# Patient Record
Sex: Male | Born: 1935 | Race: White | Hispanic: No | Marital: Married | Smoking: Never smoker
Health system: Southern US, Community
[De-identification: ages and names within clinical notes are randomized; demographics above are authoritative.]

## PROBLEM LIST (undated history)

## (undated) DIAGNOSIS — E785 Hyperlipidemia, unspecified: Secondary | ICD-10-CM

## (undated) DIAGNOSIS — T7840XA Allergy, unspecified, initial encounter: Secondary | ICD-10-CM

## (undated) DIAGNOSIS — F419 Anxiety disorder, unspecified: Secondary | ICD-10-CM

## (undated) DIAGNOSIS — D126 Benign neoplasm of colon, unspecified: Secondary | ICD-10-CM

## (undated) DIAGNOSIS — H8110 Benign paroxysmal vertigo, unspecified ear: Secondary | ICD-10-CM

## (undated) DIAGNOSIS — I10 Essential (primary) hypertension: Secondary | ICD-10-CM

## (undated) HISTORY — DX: Hyperlipidemia, unspecified: E78.5

## (undated) HISTORY — DX: Benign paroxysmal vertigo, unspecified ear: H81.10

## (undated) HISTORY — PX: OTHER SURGICAL HISTORY: SHX169

## (undated) HISTORY — DX: Benign neoplasm of colon, unspecified: D12.6

## (undated) HISTORY — DX: Essential (primary) hypertension: I10

## (undated) HISTORY — DX: Allergy, unspecified, initial encounter: T78.40XA

## (undated) HISTORY — DX: Anxiety disorder, unspecified: F41.9

---

## 1999-10-29 ENCOUNTER — Encounter: Admission: RE | Admit: 1999-10-29 | Discharge: 1999-10-29 | Payer: Self-pay | Admitting: Family Medicine

## 1999-10-29 ENCOUNTER — Encounter: Payer: Self-pay | Admitting: Family Medicine

## 2005-02-11 ENCOUNTER — Ambulatory Visit: Payer: Self-pay | Admitting: Family Medicine

## 2005-02-12 ENCOUNTER — Ambulatory Visit: Payer: Self-pay

## 2006-02-19 DIAGNOSIS — D126 Benign neoplasm of colon, unspecified: Secondary | ICD-10-CM

## 2006-02-19 HISTORY — DX: Benign neoplasm of colon, unspecified: D12.6

## 2006-03-02 ENCOUNTER — Ambulatory Visit: Payer: Self-pay | Admitting: Gastroenterology

## 2006-03-18 ENCOUNTER — Ambulatory Visit: Payer: Self-pay | Admitting: Gastroenterology

## 2006-03-18 ENCOUNTER — Encounter (INDEPENDENT_AMBULATORY_CARE_PROVIDER_SITE_OTHER): Payer: Self-pay | Admitting: *Deleted

## 2006-03-18 HISTORY — PX: COLONOSCOPY: SHX174

## 2006-06-19 ENCOUNTER — Ambulatory Visit: Payer: Self-pay | Admitting: Family Medicine

## 2006-09-03 HISTORY — PX: MOHS SURGERY: SHX181

## 2006-12-09 DIAGNOSIS — I10 Essential (primary) hypertension: Secondary | ICD-10-CM

## 2009-06-20 ENCOUNTER — Telehealth: Payer: Self-pay | Admitting: Family Medicine

## 2009-06-26 ENCOUNTER — Ambulatory Visit: Payer: Self-pay | Admitting: Family Medicine

## 2009-06-26 DIAGNOSIS — L309 Dermatitis, unspecified: Secondary | ICD-10-CM

## 2009-06-26 DIAGNOSIS — Z860100 Personal history of colon polyps, unspecified: Secondary | ICD-10-CM | POA: Insufficient documentation

## 2009-06-26 DIAGNOSIS — J309 Allergic rhinitis, unspecified: Secondary | ICD-10-CM | POA: Insufficient documentation

## 2009-06-26 DIAGNOSIS — N401 Enlarged prostate with lower urinary tract symptoms: Secondary | ICD-10-CM

## 2009-10-03 ENCOUNTER — Ambulatory Visit: Payer: Self-pay | Admitting: Family Medicine

## 2009-10-03 DIAGNOSIS — H8110 Benign paroxysmal vertigo, unspecified ear: Secondary | ICD-10-CM | POA: Insufficient documentation

## 2009-11-05 ENCOUNTER — Telehealth: Payer: Self-pay | Admitting: Family Medicine

## 2009-11-20 ENCOUNTER — Encounter: Admission: RE | Admit: 2009-11-20 | Discharge: 2009-12-17 | Payer: Self-pay | Admitting: Family Medicine

## 2009-12-20 ENCOUNTER — Encounter: Payer: Self-pay | Admitting: Family Medicine

## 2010-05-19 LAB — CONVERTED CEMR LAB
ALT: 36 units/L (ref 0–53)
AST: 24 units/L (ref 0–37)
Albumin: 4.2 g/dL (ref 3.5–5.2)
Alkaline Phosphatase: 55 units/L (ref 39–117)
BUN: 17 mg/dL (ref 6–23)
Basophils Absolute: 0 10*3/uL (ref 0.0–0.1)
Basophils Relative: 0.9 % (ref 0.0–3.0)
Bilirubin, Direct: 0 mg/dL (ref 0.0–0.3)
CO2: 35 meq/L — ABNORMAL HIGH (ref 19–32)
Calcium: 9.3 mg/dL (ref 8.4–10.5)
Chloride: 107 meq/L (ref 96–112)
Cholesterol: 179 mg/dL (ref 0–200)
Creatinine, Ser: 0.9 mg/dL (ref 0.4–1.5)
Eosinophils Absolute: 0.2 10*3/uL (ref 0.0–0.7)
Eosinophils Relative: 4 % (ref 0.0–5.0)
GFR calc non Af Amer: 88.17 mL/min (ref >60)
Glucose, Bld: 99 mg/dL (ref 70–99)
HCT: 47.6 % (ref 39.0–52.0)
HDL: 57 mg/dL (ref >39.00)
Hemoglobin: 15.8 g/dL (ref 13.0–17.0)
LDL Cholesterol: 110 mg/dL — ABNORMAL HIGH (ref 0–99)
Lymphocytes Relative: 21.9 % (ref 12.0–46.0)
Lymphs Abs: 1 10*3/uL (ref 0.7–4.0)
MCHC: 33.1 g/dL (ref 30.0–36.0)
MCV: 98.2 fL (ref 78.0–100.0)
Monocytes Absolute: 0.3 10*3/uL (ref 0.1–1.0)
Monocytes Relative: 7.5 % (ref 3.0–12.0)
Neutro Abs: 3.1 10*3/uL (ref 1.4–7.7)
Neutrophils Relative %: 65.7 % (ref 43.0–77.0)
PSA: 1.63 ng/mL (ref 0.10–4.00)
Phosphorus: 3.4 mg/dL (ref 2.3–4.6)
Platelets: 232 10*3/uL (ref 150.0–400.0)
Potassium: 4.3 meq/L (ref 3.5–5.1)
RBC: 4.84 M/uL (ref 4.22–5.81)
RDW: 12.1 % (ref 11.5–14.6)
Sodium: 145 meq/L (ref 135–145)
TSH: 0.98 microintl units/mL (ref 0.35–5.50)
Total Bilirubin: 0.7 mg/dL (ref 0.3–1.2)
Total CHOL/HDL Ratio: 3
Total Protein: 7.4 g/dL (ref 6.0–8.3)
Triglycerides: 60 mg/dL (ref 0.0–149.0)
VLDL: 12 mg/dL (ref 0.0–40.0)
WBC: 4.6 10*3/uL (ref 4.5–10.5)

## 2010-05-21 NOTE — Progress Notes (Signed)
Summary: wants antibotic  Phone Note Call from Patient Call back at Home Phone [phone]   Caller: Spouse Call For: Nelwyn Salisbury MD Summary of Call: pt is having n/v, diarrhea and wants an antibotic called into [facility] Initial call taken by: Alfred Levins, CMA,  June 20, 2009 1:40 PM  Follow-up for Phone Call        we do not treat viral gastroenteritis with antibiotics. Drink fluids and try Imodium for diarrhea. Call in phenergan 25 mg tabs, 1 q 4hours as needed nausea, #30 with no rf Follow-up by: Nelwyn Salisbury MD,  June 20, 2009 5:13 PM  Additional Follow-up for Phone Call Additional follow up Details #1::        Phone Call Completed, Rx Called In Additional Follow-up by: Alfred Levins, CMA,  June 20, 2009 5:15 PM    New/Updated Medications: PROMETHAZINE HCL 25 MG  TABS (PROMETHAZINE HCL) 1 by mouth every 4 hours as needed for nausea Prescriptions: PROMETHAZINE HCL 25 MG  TABS (PROMETHAZINE HCL) 1 by mouth every 4 hours as needed for nausea  #30 x 0   Entered by:   Alfred Levins, CMA   Authorized by:   Nelwyn Salisbury MD   Signed by:   Alfred Levins, CMA on 06/20/2009   Method used:   Electronically to        [facility] (retail)       [address]       [place], [place]  [address]       Ph: [phone]       Fax: [phone]   RxID:   [id number]

## 2010-05-21 NOTE — Assessment & Plan Note (Signed)
Summary: F/U ON DIZZINESS // RS   Vital Signs:  Patient profile:   75 year old male Weight:      184 pounds Temp:     98.0 degrees F oral Pulse rate:   56 / minute Pulse rhythm:   regular BP sitting:   144 / 86  (left arm) Cuff size:   regular  Vitals Entered By: Raechel Ache, RN (October 03, 2009 9:23 AM) CC: C/o feeling worse with dizziness and fast pulse at night.   History of Present Illness: Here to discuss continued episodes of dizziness which he ddescribes as the room spinning. This occurs sevral days a week, and it lasts anywhere from a few minutes to an hour or so each time. Moving his head quickly from side to side is usually what triggers it. No other neurologic deficits. This started about 3 months ago.   Allergies: No Known Drug Allergies  Past History:  Past Medical History: Reviewed history from 06/26/2009 and no changes required. Glaucoma, sees Dr. Fransico Michael in [facility] Cholesterol Hypertension Allergic rhinitis catarracts normal stress Myoview 2006  Past Surgical History: Reviewed history from 06/26/2009 and no changes required. R foot nerve ablation MOHS to remove a basal cell carcinoma from the right ear lobe 09-03-06 per Dr. Ellen Henri colonoscopy 03-18-06 per Dr. Russella Dar, repeat in 5 yrs  Review of Systems  The patient denies anorexia, fever, weight loss, weight gain, vision loss, decreased hearing, hoarseness, chest pain, syncope, dyspnea on exertion, peripheral edema, prolonged cough, headaches, hemoptysis, abdominal pain, melena, hematochezia, severe indigestion/heartburn, hematuria, incontinence, genital sores, muscle weakness, suspicious skin lesions, transient blindness, difficulty walking, depression, unusual weight change, abnormal bleeding, enlarged lymph nodes, angioedema, breast masses, and testicular masses.    Physical Exam  General:  Well-developed,well-nourished,in no acute distress; alert,appropriate and cooperative throughout  examination Head:  Normocephalic and atraumatic without obvious abnormalities. No apparent alopecia or balding. Eyes:  No corneal or conjunctival inflammation noted. EOMI. Perrla. Funduscopic exam benign, without hemorrhages, exudates or papilledema. Vision grossly normal. Ears:  External ear exam shows no significant lesions or deformities.  Otoscopic examination reveals clear canals, tympanic membranes are intact bilaterally without bulging, retraction, inflammation or discharge. Hearing is grossly normal bilaterally. Neck:  No deformities, masses, or tenderness noted. Lungs:  Normal respiratory effort, chest expands symmetrically. Lungs are clear to auscultation, no crackles or wheezes. Heart:  Normal rate and regular rhythm. S1 and S2 normal without gallop, murmur, click, rub or other extra sounds. Neurologic:  No cranial nerve deficits noted. Station and gait are normal. Plantar reflexes are down-going bilaterally. DTRs are symmetrical throughout. Sensory, motor and coordinative functions appear intact.   Impression & Recommendations:  Problem # 1:  BENIGN POSITIONAL VERTIGO (ICD-386.11)  Complete Medication List: 1)  Triamcinolone Acetonide 0.5 % Crea (Triamcinolone acetonide) .... Apply two times a day as needed  Patient Instructions: 1)  Since this has been going on for awhile, I suggested vestibular rehab to teach him how to eradicate the symptoms. He wants to think about it first, and he will let me know what he wants to do.

## 2010-05-21 NOTE — Assessment & Plan Note (Signed)
Summary: emp-will fast//ccm/pt rsc/cjr   Vital Signs:  Patient profile:   75 year old male Height:      72 inches Weight:      186 pounds BMI:     25.32 Temp:     97.7 degrees F oral Pulse rate:   62 / minute BP sitting:   122 / 84  (left arm) Cuff size:   large  Vitals Entered By: Alfred Levins, CMA (June 26, 2009 9:04 AM) CC: cpx, fasting   Contraindications/Deferment of Procedures/Staging:    Test/Procedure: Pneumovax vaccine    Reason for deferment: patient declined   History of Present Illness: 75 yr old male for cpx. He feels well in general. He and his wife are under a lot of stress, and he spends a great deal of time telling me about this. They had used a lot of their retirement funds to help out their children financially, but this left them very short on money. Now theyt are faced with foreclosure, and they are losing their home. he thinks he is handling this well, and he does not need any assistance from [place]. he is worried about how this is affecting his wife. He does have itchy patches of skin on his lower back.   Current Medications (verified): 1)  None  Allergies (verified): No Known Drug Allergies  Past History:  Past Medical History: Glaucoma, sees Dr. Fransico Michael in [facility] Cholesterol Hypertension Allergic rhinitis catarracts normal stress Myoview 2006  Past Surgical History: R foot nerve ablation MOHS to remove a basal cell carcinoma from the right ear lobe 09-03-06 per Dr. Ellen Henri colonoscopy 03-18-06 per Dr. Russella Dar, repeat in 5 yrs  Family History: Reviewed history from 12/09/2006 and no changes required. Family History Breast cancer 1st degree relative <50 Family History Hypertension Family History of Prostate CA 1st degree relative <50 Family History of Stroke M 1st degree relative <50 Family History of Cardiovascular disorder Family History of Respiratory disease  Social History: Reviewed history from 12/09/2006 and no changes  required. Never Smoked Retired Married Alcohol use-yes Drug use-no  Review of Systems  The patient denies anorexia, fever, weight loss, weight gain, vision loss, decreased hearing, hoarseness, chest pain, syncope, dyspnea on exertion, peripheral edema, prolonged cough, headaches, hemoptysis, abdominal pain, melena, hematochezia, severe indigestion/heartburn, hematuria, incontinence, genital sores, muscle weakness, suspicious skin lesions, transient blindness, difficulty walking, depression, unusual weight change, abnormal bleeding, enlarged lymph nodes, angioedema, breast masses, and testicular masses.    Physical Exam  General:  Well-developed,well-nourished,in no acute distress; alert,appropriate and cooperative throughout examination Head:  Normocephalic and atraumatic without obvious abnormalities. No apparent alopecia or balding. Eyes:  No corneal or conjunctival inflammation noted. EOMI. Perrla. Funduscopic exam benign, without hemorrhages, exudates or papilledema. Vision grossly normal. Ears:  External ear exam shows no significant lesions or deformities.  Otoscopic examination reveals clear canals, tympanic membranes are intact bilaterally without bulging, retraction, inflammation or discharge. Hearing is grossly normal bilaterally. Nose:  External nasal examination shows no deformity or inflammation. Nasal mucosa are pink and moist without lesions or exudates. Mouth:  Oral mucosa and oropharynx without lesions or exudates.  Teeth in good repair. Neck:  No deformities, masses, or tenderness noted. Chest Wall:  No deformities, masses, tenderness or gynecomastia noted. Lungs:  Normal respiratory effort, chest expands symmetrically. Lungs are clear to auscultation, no crackles or wheezes. Heart:  Normal rate and regular rhythm. S1 and S2 normal without gallop, murmur, click, rub or other extra sounds. EKG normal Abdomen:  Bowel sounds positive,abdomen soft and non-tender without masses,  organomegaly or hernias noted. Rectal:  No external abnormalities noted. Normal sphincter tone. No rectal masses or tenderness. Heme neg.  Genitalia:  Testes bilaterally descended without nodularity, tenderness or masses. No scrotal masses or lesions. No penis lesions or urethral discharge. Prostate:  no nodules, no asymmetry, no induration, and 1+ enlarged.   Msk:  No deformity or scoliosis noted of thoracic or lumbar spine.   Pulses:  R and L carotid,radial,femoral,dorsalis pedis and posterior tibial pulses are full and equal bilaterally Extremities:  No clubbing, cyanosis, edema, or deformity noted with normal full range of motion of all joints.   Neurologic:  No cranial nerve deficits noted. Station and gait are normal. Plantar reflexes are down-going bilaterally. DTRs are symmetrical throughout. Sensory, motor and coordinative functions appear intact. Skin:  Intact without suspicious lesions. Has a large patch of red, scaly, macular skin on the lower back Cervical Nodes:  No lymphadenopathy noted Axillary Nodes:  No palpable lymphadenopathy Inguinal Nodes:  No significant adenopathy Psych:  Cognition and judgment appear intact. Alert and cooperative with normal attention span and concentration. No apparent delusions, illusions, hallucinations   Impression & Recommendations:  Problem # 1:  BENIGN PROSTATIC HYPERTROPHY, WITH OBSTRUCTION (ICD-600.01)  Orders: TLB-PSA (Prostate Specific Antigen) (84153-PSA)  Problem # 2:  COLONIC POLYPS, HX OF (ICD-V12.72)  Orders: Hemoccult Guaiac-1 spec.(in office) (82270)  Problem # 3:  ALLERGIC RHINITIS (ICD-477.9)  The following medications were removed from the medication list:    Promethazine Hcl 25 Mg Tabs (Promethazine hcl) .Marland Kitchen... 1 by mouth every 4 hours as needed for nausea  Problem # 4:  HYPERTENSION (ICD-401.9)  Orders: EKG w/ Interpretation (93000) UA Dipstick w/o Micro (automated)  (81003) Venipuncture (16109) TLB-Lipid Panel  (80061-LIPID) TLB-Renal Function Panel (80069-RENAL) TLB-BMP (Basic Metabolic Panel-BMET) (80048-METABOL) TLB-CBC Platelet - w/Differential (85025-CBCD) TLB-Hepatic/Liver Function Pnl (80076-HEPATIC) TLB-TSH (Thyroid Stimulating Hormone) (84443-TSH)  Problem # 5:  ECZEMA (ICD-692.9)  The following medications were removed from the medication list:    Promethazine Hcl 25 Mg Tabs (Promethazine hcl) .Marland Kitchen... 1 by mouth every 4 hours as needed for nausea His updated medication list for this problem includes:    Triamcinolone Acetonide 0.5 % Crea (Triamcinolone acetonide) .Marland Kitchen... Apply two times a day as needed  Complete Medication List: 1)  Triamcinolone Acetonide 0.5 % Crea (Triamcinolone acetonide) .... Apply two times a day as needed  Patient Instructions: 1)  get labs today. Prescriptions: TRIAMCINOLONE ACETONIDE 0.5 % CREA (TRIAMCINOLONE ACETONIDE) apply two times a day as needed  #60 x 5   Entered and Authorized by:   Nelwyn Salisbury MD   Signed by:   Nelwyn Salisbury MD on 06/26/2009   Method used:   Electronically to        [facility] (retail)       [address]       [place], [place]  [address]       Ph: [phone]       Fax: [phone]   RxID:   [id number]   Preventive Care Screening  Colonoscopy:    Date:  04/21/2006    Next Due:  04/2011    Results:  polyps    Appended Document: emp-will fast//ccm/pt rsc/cjr  Laboratory Results   Urine Tests    Routine Urinalysis   Color: yellow Appearance: Clear Glucose: negative   (Normal Range: Negative) Bilirubin: negative   (Normal Range: Negative)  Ketone: negative   (Normal Range: Negative) Spec. Gravity: 1.020   (Normal Range: 1.003-1.035) Blood: negative   (Normal Range: Negative) pH: 7.0   (Normal Range: 5.0-8.0) Protein: negative   (Normal Range: Negative) Urobilinogen: 0.2   (Normal Range: 0-1) Nitrite: negative   (Normal Range: Negative) Leukocyte Esterace: negative    (Normal Range: Negative)    Comments: Rita Ohara  June 26, 2009 10:59 AM

## 2010-05-21 NOTE — Progress Notes (Signed)
  Phone Note Call from Patient Call back at Home Phone [phone] Call back at Work Phone [phone]   Reason for Call: Referral Summary of Call: pt wants to get referral completed that he had talked to Dr. Clent Ridges about for head dizziness issue.  Follow-up for Phone Call        refer him to PT for vestibular rehab for chronic vertigo Follow-up by: Nelwyn Salisbury MD,  November 05, 2009 8:58 AM

## 2010-05-21 NOTE — Miscellaneous (Signed)
Summary: Initial Summary for PT/[place]  Initial Summary for PT/[place]   Imported By: Sherian Rein 12/26/2009 13:55:44  _____________________________________________________________________  External Attachment:    Type:   Image     Comment:   External Document

## 2010-06-20 ENCOUNTER — Inpatient Hospital Stay (HOSPITAL_COMMUNITY)
Admission: EM | Admit: 2010-06-20 | Discharge: 2010-06-22 | DRG: 309 | Disposition: A | Discharge status: Home / Self Care | Payer: Medicare Other | Attending: Internal Medicine | Admitting: Internal Medicine

## 2010-06-20 ENCOUNTER — Emergency Department (HOSPITAL_COMMUNITY): Payer: Medicare Other

## 2010-06-20 DIAGNOSIS — I5032 Chronic diastolic (congestive) heart failure: Secondary | ICD-10-CM | POA: Diagnosis present

## 2010-06-20 DIAGNOSIS — Z803 Family history of malignant neoplasm of breast: Secondary | ICD-10-CM

## 2010-06-20 DIAGNOSIS — Z8249 Family history of ischemic heart disease and other diseases of the circulatory system: Secondary | ICD-10-CM

## 2010-06-20 DIAGNOSIS — Z7982 Long term (current) use of aspirin: Secondary | ICD-10-CM

## 2010-06-20 DIAGNOSIS — Z823 Family history of stroke: Secondary | ICD-10-CM

## 2010-06-20 DIAGNOSIS — I4891 Unspecified atrial fibrillation: Principal | ICD-10-CM | POA: Diagnosis present

## 2010-06-20 DIAGNOSIS — I509 Heart failure, unspecified: Secondary | ICD-10-CM | POA: Diagnosis present

## 2010-06-20 LAB — CBC
HCT: 42 % (ref 39.0–52.0)
Hemoglobin: 15.2 g/dL (ref 13.0–17.0)
MCH: 32.9 pg (ref 26.0–34.0)
MCHC: 36.2 g/dL — ABNORMAL HIGH (ref 30.0–36.0)
MCV: 90.9 fL (ref 78.0–100.0)
Platelets: 216 10*3/uL (ref 150–400)
RBC: 4.62 MIL/uL (ref 4.22–5.81)
RDW: 12.6 % (ref 11.5–15.5)
WBC: 7.6 10*3/uL (ref 4.0–10.5)

## 2010-06-20 LAB — BASIC METABOLIC PANEL
BUN: 23 mg/dL (ref 6–23)
CO2: 25 mEq/L (ref 19–32)
Calcium: 9.4 mg/dL (ref 8.4–10.5)
Chloride: 107 mEq/L (ref 96–112)
Creatinine, Ser: 1 mg/dL (ref 0.4–1.5)
GFR calc Af Amer: >60 mL/min (ref >60)
GFR calc non Af Amer: >60 mL/min (ref >60)
Glucose, Bld: 99 mg/dL (ref 70–99)
Potassium: 3.9 mEq/L (ref 3.5–5.1)
Sodium: 142 mEq/L (ref 135–145)

## 2010-06-20 LAB — DIFFERENTIAL
Basophils Absolute: 0 10*3/uL (ref 0.0–0.1)
Basophils Relative: 0 % (ref 0–1)
Eosinophils Absolute: 0.3 10*3/uL (ref 0.0–0.7)
Eosinophils Relative: 4 % (ref 0–5)
Lymphocytes Relative: 24 % (ref 12–46)
Lymphs Abs: 1.8 10*3/uL (ref 0.7–4.0)
Monocytes Absolute: 0.7 10*3/uL (ref 0.1–1.0)
Monocytes Relative: 9 % (ref 3–12)
Neutro Abs: 4.8 10*3/uL (ref 1.7–7.7)
Neutrophils Relative %: 63 % (ref 43–77)

## 2010-06-21 DIAGNOSIS — I4891 Unspecified atrial fibrillation: Secondary | ICD-10-CM

## 2010-06-21 LAB — COMPREHENSIVE METABOLIC PANEL
AST: 22 U/L (ref 0–37)
Albumin: 3.5 g/dL (ref 3.5–5.2)
Chloride: 107 mEq/L (ref 96–112)
Creatinine, Ser: 0.94 mg/dL (ref 0.4–1.5)
GFR calc Af Amer: >60 mL/min (ref >60)
Total Bilirubin: 0.9 mg/dL (ref 0.3–1.2)

## 2010-06-21 LAB — CARDIAC PANEL(CRET KIN+CKTOT+MB+TROPI)
CK, MB: 2.7 ng/mL (ref 0.3–4.0)
CK, MB: 2.3 ng/mL (ref 0.3–4.0)
Relative Index: 2.3 (ref 0.0–2.5)
Relative Index: INVALID (ref 0.0–2.5)
Total CK: 86 U/L (ref 7–232)
Troponin I: 0.01 ng/mL (ref 0.00–0.06)

## 2010-06-21 LAB — HEMOGLOBIN A1C
Hgb A1c MFr Bld: 5.8 % — ABNORMAL HIGH (ref <5.7)
Mean Plasma Glucose: 120 mg/dL — ABNORMAL HIGH (ref <117)

## 2010-06-21 LAB — LIPID PANEL
Cholesterol: 195 mg/dL (ref 0–200)
LDL Cholesterol: 131 mg/dL — ABNORMAL HIGH (ref 0–99)
VLDL: 9 mg/dL (ref 0–40)

## 2010-06-21 LAB — CBC
HCT: 40.4 % (ref 39.0–52.0)
Hemoglobin: 14.1 g/dL (ref 13.0–17.0)
MCH: 32.3 pg (ref 26.0–34.0)
MCV: 92.4 fL (ref 78.0–100.0)
RBC: 4.37 MIL/uL (ref 4.22–5.81)

## 2010-06-21 LAB — HEPARIN LEVEL (UNFRACTIONATED): Heparin Unfractionated: 0.75 IU/mL — ABNORMAL HIGH (ref 0.30–0.70)

## 2010-06-22 ENCOUNTER — Encounter: Payer: Self-pay | Admitting: Family Medicine

## 2010-06-22 LAB — BASIC METABOLIC PANEL
BUN: 17 mg/dL (ref 6–23)
Calcium: 8.7 mg/dL (ref 8.4–10.5)
Creatinine, Ser: 0.98 mg/dL (ref 0.4–1.5)
GFR calc non Af Amer: >60 mL/min (ref >60)

## 2010-06-22 LAB — CBC
Platelets: 176 10*3/uL (ref 150–400)
RDW: 12.6 % (ref 11.5–15.5)
WBC: 5 10*3/uL (ref 4.0–10.5)

## 2010-06-25 ENCOUNTER — Encounter: Payer: Self-pay | Admitting: Family Medicine

## 2010-06-25 ENCOUNTER — Ambulatory Visit (INDEPENDENT_AMBULATORY_CARE_PROVIDER_SITE_OTHER): Payer: Medicare Other | Admitting: Family Medicine

## 2010-06-25 VITALS — BP 140/78 | Temp 97.9°F | Ht 72.0 in | Wt 188.0 lb

## 2010-06-25 DIAGNOSIS — F419 Anxiety disorder, unspecified: Secondary | ICD-10-CM

## 2010-06-25 DIAGNOSIS — G4730 Sleep apnea, unspecified: Secondary | ICD-10-CM

## 2010-06-25 DIAGNOSIS — L719 Rosacea, unspecified: Secondary | ICD-10-CM

## 2010-06-25 DIAGNOSIS — I5032 Chronic diastolic (congestive) heart failure: Secondary | ICD-10-CM

## 2010-06-25 DIAGNOSIS — I509 Heart failure, unspecified: Secondary | ICD-10-CM

## 2010-06-25 DIAGNOSIS — F411 Generalized anxiety disorder: Secondary | ICD-10-CM

## 2010-06-25 DIAGNOSIS — I480 Paroxysmal atrial fibrillation: Secondary | ICD-10-CM

## 2010-06-25 DIAGNOSIS — I4891 Unspecified atrial fibrillation: Secondary | ICD-10-CM

## 2010-06-25 MED ORDER — SERTRALINE HCL 50 MG PO TABS: 50.0000 mg | ORAL_TABLET | Freq: Every day | ORAL | Status: DC

## 2010-06-25 MED ORDER — METRONIDAZOLE 1 % EX GEL: Freq: Two times a day (BID) | CUTANEOUS | Status: AC

## 2010-06-25 NOTE — Progress Notes (Signed)
  Subjective:    Patient ID: Roy Obrien, male    DOB: [birth year], 75 y.o.   MRN: [id number]  HPI Here to follow up after a hospital stay from 06-20-10 to 06-22-10 for paroxysmal atrial fibrillation. He had a rapid ventricular rate at first which was controlled with a Cardizem drip. He quickly converted to NSR, and he has felt fine ever since then. No SOB or palpitations or chest pains. He was sent home on aspirin for anticoagulation with a CHADS score of 2, since he also showed some diastolic heart failure. His EF was 60-65%. There was some debate about putting him on Pradaxa or Warfarin, but this decision was left up to [place]. He also mentions a redness and soreness around the tip of the nose for the past month. No problems with the insides of the nostrils. He has been told by his wife and others that he stops breathing frequently in his sleep. He does snore at times. Lastly he admits to being under a lot of stress lately, and he feels like his body is always wound up with stress. He can't relax and has trouble sleeping.    Review of Systems  Constitutional: Negative.   Respiratory: Negative.   Cardiovascular: Negative.   Skin: Positive for rash.       Objective:   Physical Exam  Constitutional: He appears well-developed and well-nourished.  HENT:  Mouth/Throat: Oropharynx is clear and moist.       the external nose is diffusely red with a macular rash  Cardiovascular: Normal rate, regular rhythm, normal heart sounds and intact distal pulses.  Exam reveals no gallop and no friction rub.   No murmur heard. Pulmonary/Chest: Effort normal and breath sounds normal. No respiratory distress. He has no wheezes. He has no rales. He exhibits no tenderness.          Assessment & Plan:  He seems to have had a single episode of atrial  fibrillation, which is now under control, so I do not think we need to go with an anticoagulant with a higher risk profile than aspirin. He agrees, so we will stick  with one 325mg  ASA daily for now. He knows to see me or go to the ER if he feels another fibrillation episode coming on. He seems to have some sleep apnea, and this poses a definite risk for heart disease, so we will refer him for a sleep evaluation. Treat the rosacea with metrogel.

## 2010-06-25 NOTE — Progress Notes (Signed)
Addended by: Dwaine Deter on: 06/25/2010 06:06 PM   Modules accepted: Orders

## 2010-07-01 ENCOUNTER — Encounter: Payer: Self-pay | Admitting: *Deleted

## 2010-07-04 NOTE — Discharge Summary (Signed)
NAMEJERREN, FLINCHBAUGH              ACCOUNT NO.:  [id number]  MEDICAL RECORD NO.:  [id number]           PATIENT TYPE:  I  LOCATION:  3702                         FACILITY:  [facility]  PHYSICIAN:  Peggye Pitt, M.D. DATE OF BIRTH:  [birth year]  DATE OF ADMISSION:  06/20/2010 DATE OF DISCHARGE:  06/22/2010                              DISCHARGE SUMMARY   PRIMARY CARE PHYSICIAN:  Jeannett Senior A. Clent Ridges, MD  DISCHARGE DIAGNOSES: 1. Paroxysmal atrial fibrillation, converted back to normal sinus     rhythm. 2. Chronic diastolic congestive heart failure, new diagnosis.  DISCHARGE MEDICATIONS:  Aspirin 325 mg daily.  DISPOSITION AND FOLLOWUP:  Mr. Kroening will be discharged home today in stable and improved condition.  He has a followup scheduled with his PCP, Dr. Clent Ridges on Tuesday which is 4 days after discharge.  At that point, he will need to discuss with Dr. Clent Ridges whether or not he will continue to take aspirin and will be upgraded to an anticoagulant such as Coumadin versus Pradaxa.  Please see below for details.  CONSULTATION THIS HOSPITALIZATION:  None.  IMAGES AND PROCEDURES: 1. A chest x-ray on June 20, 2010, that showed no active     cardiopulmonary disease. 2. A 2-D echocardiogram on June 21, 2010, that showed an ejection     fraction of 60-65% with mild LVH and grade 2 diastolic dysfunction,     no vulvar abnormalities.  HISTORY AND PHYSICAL:  For complete details, please refer to dictation on June 20, 2010, by Dr. Toniann Fail, but in brief Mr. Tritschler is a pleasant 75 year old Caucasian gentleman with absolutely no past medical history who presented to the emergency department with complaints of palpitations and dizziness.  This started about 2 days prior to admission.  Because of this, he came to the ER where he was found to be in atrial fibrillation with rapid ventricular response.  HOSPITAL COURSE BY PROBLEM: 1. Atrial fibrillation with rapid ventricular response:  This was   noted in the ED on an initial EKG.  He was started on a Cardizem     drip and later on p.o. Cardizem.  However, his heart rate rapidly     dropped to the 40s-50s and he had two 4-second pauses on telemetry.     Because of this, the Cardizem was discontinued.  He has since     converted to a normal sinus rhythm and has been maintaining heart     rates in the 50s-60s.  Because of this, I will not be discharging     him on any rate-controlling medications.  We did have to do a full     workup to determine his CHADS score to see if he would qualify for     anticoagulation.  He has two points, one for his age and the second     one for his diastolic dysfunction which is newly diagnosed on     echocardiogram this admission.  Of course, folks who have a CHADS     score of 2, it is up to physician and patient to determine whether     aspirin or higher level  of anticoagulation is appropriate.  If he     had a CHADS score of 3 or higher, he would definitely qualify for     anticoagulation.  After discussion with Mr. Depree, he prefers at this time to be discharged on a full-dose aspirin 325 mg.  He     definitely does not want Coumadin, but would be willing to consider     one of the newer anticoagulants such as per Pradaxa or Xarelto,     however, he would further like to discuss this with Dr. Clent Ridges prior     to making his decision.  He understands that there is an increased     risk of stroke in folks with atrial fibrillation. 2. It has been a pleasure taking care of Mr. Baquero.  He is most     certainly a very pleasant gentleman and has been in remarkable good     health.  VITAL SIGNS ON DAY OF DISCHARGE:  Blood pressure 121/63, heart rate 56, respirations 18, sats 99% on room air, and temp 976.     Peggye Pitt, M.D.     EH/MEDQ  D:  06/22/2010  T:  06/22/2010  Job:  [id number]  cc:   Jeannett Senior A. Clent Ridges, MD  Electronically Signed by Peggye Pitt M.D. on 07/04/2010 05:38:27 PM

## 2010-07-12 ENCOUNTER — Ambulatory Visit (INDEPENDENT_AMBULATORY_CARE_PROVIDER_SITE_OTHER): Payer: Medicare Other | Admitting: Pulmonary Disease

## 2010-07-12 ENCOUNTER — Encounter: Payer: Self-pay | Admitting: Pulmonary Disease

## 2010-07-12 VITALS — BP 138/64 | HR 64 | Temp 98.0°F | Ht 72.0 in | Wt 188.8 lb

## 2010-07-12 DIAGNOSIS — R0609 Other forms of dyspnea: Secondary | ICD-10-CM

## 2010-07-12 DIAGNOSIS — R0683 Snoring: Secondary | ICD-10-CM | POA: Insufficient documentation

## 2010-07-12 NOTE — Progress Notes (Signed)
  Subjective:    Patient ID: Roy Obrien, male    DOB: March 02, 1936, 75 y.o.   MRN: [id number]  HPI The pt comes in today for evaluation of possible osa.  He was recently found to have PAF, and the question was raised whether he may have osa.  He has been noted to have snoring, and occasionally an abnormal breathing pattern during sleep.  He feels that he is rested 90% of the mornings upon arising.  He does not feel he has a sleepiness issue during the day, and is satisfied with his concentration and alertness.  His weight is down 20 pounds over the last one year, and his epworth today is 9.     Sleep Questionnaire: What time do you typically go to bed?( Between what hours) 10:30 - 11:00pm How long does it take you to fall asleep? 5 mins How many times during the night do you wake up? 2 What time do you get out of bed to start your day? 0730 Do you drive or operate heavy machinery in your occupation? No How much has your weight changed (up or down) over the past two years? (In pounds) 20 lb (9.072 kg) Have you ever had a sleep study before? No Do you currently use CPAP? No Do you wear oxygen at any time? No    Review of Systems  Constitutional: Negative for fever, appetite change and unexpected weight change.  HENT: Positive for sneezing. Negative for ear pain, congestion, sore throat, rhinorrhea, trouble swallowing, dental problem and postnasal drip.   Eyes: Negative for redness.  Respiratory: Negative for cough, shortness of breath and wheezing.   Cardiovascular: Positive for palpitations. Negative for chest pain and leg swelling.  Gastrointestinal: Negative for nausea, abdominal pain and diarrhea.  Genitourinary: Negative for dysuria.  Musculoskeletal: Positive for joint swelling.  Skin: Positive for rash.  Neurological: Negative for syncope and headaches.  Hematological: Does not bruise/bleed easily.  Psychiatric/Behavioral: Negative for dysphoric mood. The patient is not nervous/anxious.         Objective:   Physical Exam  Constitutional: He is oriented to person, place, and time. He appears well-developed and well-nourished.  HENT:       Nares narrowed bilat, no discharge or purulence Moderate elongation of soft palate and uvula  Eyes: EOM are normal. Pupils are equal, round, and reactive to light.  Neck: Neck supple. No JVD present. No tracheal deviation present. No thyromegaly present.  Cardiovascular: Normal rate, regular rhythm and normal heart sounds.   Pulmonary/Chest: Effort normal and breath sounds normal. No respiratory distress. He has no wheezes. He has no rales.  Abdominal: Soft. Bowel sounds are normal. There is no tenderness.  Musculoskeletal: He exhibits no edema.  Lymphadenopathy:    He has no cervical adenopathy.  Neurological: He is alert and oriented to person, place, and time.       Does not appear sleepy, moves all 4   Skin:       No cyanosis          Assessment & Plan:

## 2010-07-12 NOTE — Assessment & Plan Note (Signed)
The pt has new onset afib and has been noted to have snoring with some suggestion of an abnormal breathing pattern during sleep.  However, he does not have the typical body habitus for osa, and his upper airway anatomy is not that abnormal.  He seems to be having restorative sleep, and denies alertness issues during the day.  My suspicion is low for significant osa, but agree he needs screening with his symptoms and recent afib.  I think this is best done with home sleep testing rather than a formal study at the sleep center.  The pt is agreeable to this approach.

## 2010-07-12 NOTE — Patient Instructions (Signed)
Will set up for home sleep study to evaluate for sleep apnea Will call you with the results when available.

## 2010-07-18 ENCOUNTER — Encounter: Payer: Self-pay | Admitting: Family Medicine

## 2010-07-18 ENCOUNTER — Ambulatory Visit (INDEPENDENT_AMBULATORY_CARE_PROVIDER_SITE_OTHER): Payer: Medicare Other | Admitting: Family Medicine

## 2010-07-18 VITALS — BP 120/78 | HR 77 | Temp 98.5°F | Wt 186.0 lb

## 2010-07-18 DIAGNOSIS — F419 Anxiety disorder, unspecified: Secondary | ICD-10-CM

## 2010-07-18 DIAGNOSIS — G4730 Sleep apnea, unspecified: Secondary | ICD-10-CM

## 2010-07-18 DIAGNOSIS — I480 Paroxysmal atrial fibrillation: Secondary | ICD-10-CM

## 2010-07-18 DIAGNOSIS — F411 Generalized anxiety disorder: Secondary | ICD-10-CM

## 2010-07-18 DIAGNOSIS — I4891 Unspecified atrial fibrillation: Secondary | ICD-10-CM

## 2010-07-18 NOTE — Progress Notes (Signed)
  Subjective:    Patient ID: Roy Obrien, male    DOB: 23-Apr-1935, 75 y.o.   MRN: [id number]  HPI Here for follow up on anxiety. We started him on Zoloft about 3 weeks ago. He has been seeing Dr. Shelle Iron about sleep apnea, and he has a sleep study pending later this week. He has felt fine with no chest discomfort or SOB or palpitations. He feels more relaxed and is pleased with the Zoloft. He sleeps better. He asks about a possible referral to Cardiology. His family is worried about him and wants him to see a cardiologist soon.    Review of Systems  Constitutional: Negative.   Respiratory: Negative.   Cardiovascular: Negative.   Psychiatric/Behavioral: Negative.        Objective:   Physical Exam  Constitutional: He appears well-developed and well-nourished.  Cardiovascular: Normal rate, regular rhythm, normal heart sounds and intact distal pulses.  Exam reveals no gallop and no friction rub.   No murmur heard. Pulmonary/Chest: Effort normal and breath sounds normal. No respiratory distress. He has no wheezes. He has no rales. He exhibits no tenderness.  Psychiatric: He has a normal mood and affect. His behavior is normal. Judgment and thought content normal.          Assessment & Plan:  He is feeling better on Zoloft, so we will stay on this. I will see him again in 2 months. We will refer to Cardiology to follow him along with [place].

## 2010-07-22 ENCOUNTER — Encounter: Payer: Self-pay | Admitting: Cardiovascular Disease

## 2010-07-22 ENCOUNTER — Ambulatory Visit (INDEPENDENT_AMBULATORY_CARE_PROVIDER_SITE_OTHER): Payer: Medicare Other | Admitting: Cardiovascular Disease

## 2010-07-22 VITALS — BP 130/80 | HR 62 | Resp 12 | Ht 72.0 in | Wt 184.0 lb

## 2010-07-22 DIAGNOSIS — R002 Palpitations: Secondary | ICD-10-CM | POA: Insufficient documentation

## 2010-07-22 DIAGNOSIS — I10 Essential (primary) hypertension: Secondary | ICD-10-CM

## 2010-07-22 DIAGNOSIS — I119 Hypertensive heart disease without heart failure: Secondary | ICD-10-CM

## 2010-07-22 DIAGNOSIS — E782 Mixed hyperlipidemia: Secondary | ICD-10-CM

## 2010-07-22 NOTE — Assessment & Plan Note (Signed)
F/U labs with Dr Clent Ridges Diet Rx Lab Results  Component Value Date   [facility]  Value: 131        Total Cholesterol/HDL:CHD Risk Coronary Heart Disease Risk Table                     Men   Women  1/2 Average Risk   3.4   3.3  Average Risk       5.0   4.4  2 X Average Risk   9.6   7.1  3 X Average Risk  23.4   11.0        Use the calculated Patient Ratio above and the CHD Risk Table to determine the patient's CHD Risk.        ATP III CLASSIFICATION (LDL):  <100     mg/dL   Optimal  161-096  mg/dL   Near or Above                    Optimal  130-159  mg/dL   Borderline  045-409  mg/dL   High  >811     mg/dL   Very High* 12/20/4780

## 2010-07-22 NOTE — Progress Notes (Signed)
New referral from Dr Clent Ridges for palpiations.  CRF's include HTN and elevated lipids.  BP only Rx once for a short time 3 years ago.  Meds made him feel worse and stopped.  Watches diet for cholesterol.  Reviewed hasopital records from [facility] 3/2.  Admitted with no arrhythmia on tele and normal echo with R/O.  5 weeks ago had 3 episodes of rapid palpiatons while working in yard.  Mild dyspnea.  No associated SSCP, diaphoresis or syncope.  No previous cardiac problems.  No recurrence since D/C   ROS: Denies fever, malais, weight loss, blurry vision, decreased visual acuity, cough, sputum, SOB, hemoptysis, pleuritic pain, palpitaitons, heartburn, abdominal pain, melena, lower extremity edema, claudication, or rash.   General: Affect appropriate Healthy:  appears stated age HEENT: normal Neck supple with no adenopathy JVP normal no bruits no thyromegaly Lungs clear with no wheezing and good diaphragmatic motion Heart:  S1/S2 no murmur,rub, gallop or click PMI normal Abdomen: benighn, BS positve, no tenderness, no AAA no bruit.  No HSM or HJR Distal pulses intact with no bruits No edema Neuro non-focal Skin warm and dry No muscular weakness   Current Outpatient Prescriptions  Medication Sig Dispense Refill  . aspirin 325 MG tablet Take 325 mg by mouth daily.        . metroNIDAZOLE (METROGEL) 1 % gel Apply topically 2 (two) times daily.  60 g  5  . sertraline (ZOLOFT) 50 MG tablet Take 1 tablet (50 mg total) by mouth daily.  30 tablet  2  . triamcinolone (KENALOG) 0.5 % cream Apply topically as needed.         Allergies  Review of patient's allergies indicates no known allergies.  Social History:  Retired Advertising account planner.  Married over 50 years.  Wifes health is poor.  Active non smoker and non drinker.  3 older children  Famiily Hisotry  Father died age 85 of stroke  Mother age 29 of ? stroke  Electrocardiogram:  NSR normal ECG rate 62  Assessment and Plan

## 2010-07-22 NOTE — Assessment & Plan Note (Signed)
Benign sounding with normal ECG,exam and echo.  ETT to R/O exercise induced arrythmia

## 2010-07-22 NOTE — Patient Instructions (Signed)
Your physician has requested that you have an exercise tolerance test. For further information please visit [email or web address]. Please also follow instruction sheet, as given.   

## 2010-07-22 NOTE — H&P (Signed)
NAMETWAIN, STENSETH              ACCOUNT NO.:  [id number]  MEDICAL RECORD NO.:  [id number]           PATIENT TYPE:  E  LOCATION:  [facility]                         FACILITY:  [facility]  PHYSICIAN:  Eduard Clos, MDDATE OF BIRTH:  1935-11-15  DATE OF ADMISSION:  06/20/2010 DATE OF DISCHARGE:                             HISTORY & PHYSICAL   PRIMARY CARE PHYSICIAN:  Tera Mater. Clent Ridges, MD  CHIEF COMPLAINT:  Palpitation.  HISTORY OF PRESENTING ILLNESS:  A 75 year old male with no significant past medical history, present to the ER with complaints of having palpitation and felt dizzy.  The patient states that he was doing fine last evening yesterday, he had some brief episode of palpitations which last for half hour to which it will resolve by itself.  Today morning, he started again and today morning, he had associated mild shortness of breath and also felt dizzy, did not lose consciousness, did not have any focal deficit, did not have any headache or visual symptoms, did not have chest pain, had mild shortness of breath earlier, did not have any diaphoresis, nausea, vomiting, abdominal pain, dysuria, discharge, diarrhea.  In the ER, the patient was found to have AFib with RVR.  The patient has not had this before.  The patient has been started on Cardizem infusion. At this time, the heart rate is around 90 per minute.  The patient will be observed for further management.  The patient states that he had gone to travel in flight and cough recently 3 days ago.  PAST MEDICAL HISTORY:  Nothing significant.  PAST SURGICAL HISTORY:  None.  MEDICATIONS PRIOR TO ADMISSION:  None.  SOCIAL HISTORY:  The patient is married, lives with his wife.  He is a full code.  Denies smoking, cigarettes, drinking alcohol or using illegal drugs.  FAMILY HISTORY:  Significant for dad having stroke in the 48s and mom having breast cancer and coronary artery disease.  REVIEW OF SYSTEMS:  As per the  history of presenting illness, nothing else significant.  PHYSICAL EXAMINATION:  GENERAL:  The patient examined at bedside not in acute distress. VITAL SIGNS:  Blood pressure is 190/70, pulse is 90 per minute, temperature 98.3, respiration 18 per minute, O2 sat 99%. HEENT:  Anicteric.  No pallor.  No facial asymmetry.  Tongue is midline. No discharge from ears, eyes, nose or mouth.  PERRLA positive. CHEST:  Bilateral air entry present.  No rhonchi.  No crepitation. HEART:  S1, S2 is heard. ABDOMEN:  Soft, nontender.  Bowel sounds heard. CNS:  The patient is alert, awake, oriented to time, place, and person. Moves right upper and lower extremities 5/5. EXTREMITIES:  Peripheral pulses felt.  No edema.  LABORATORY STUDIES:  EKG shows atrial fibrillation with rate around 131 beats per minute with J-point elevation, which I have confirmed with cardiologist that early repolarization changes in the EKG plus no old EKG to compare.  Chest x-ray, no active cardiopulmonary disease findings related to COPD.  CBC; WBC is 7.6, hemoglobin is 15.2, hematocrit is 42, platelets are 216.  Basic metabolic panel; sodium 142, potassium 3.9, chloride 107, carbon dioxide 25,  glucose 99, BUN 23, creatinine 1, calcium 9.4.  ASSESSMENT: 1. Atrial fibrillation with rapid ventricular rate. 2. Neck pain.  PLAN: 1. At this time, we will admit the patient to telemetry. 2. For his paroxysmal atrial fibrillation with rapid ventricular     response, at this time, the patient has been started on Cardizem     infusion, which he will continue and titrate for heart rate to     maintain between 60-90 and systolic blood pressure more than 100.     At this time, I am going to start Cardizem p.o. after which we will     try to taper off the Cardizem infusion. 3. I am going to check with D-dimer and a BNP along with TSH and free     T3 and free T4.  If the D-dimer is high, we have to do further test     to check for  pulmonary embolism or deep venous thrombosis. 4. At this time, I am going to place the patient on heparin and if the     patient needs to be start on Coumadin will be based on the     patient's clinical course and the test ordered. 5. The patient is a full code.     Eduard Clos, MD     ANK/MEDQ  D:  06/20/2010  T:  06/20/2010  Job:  [id number]  cc:   Tera Mater. Clent Ridges, MD  Electronically Signed by Midge Minium MD on 07/22/2010 07:21:20 AM

## 2010-07-22 NOTE — Assessment & Plan Note (Signed)
Low salt diet.  See what BP is with exercise.  May need to resume meds

## 2010-07-25 ENCOUNTER — Ambulatory Visit (INDEPENDENT_AMBULATORY_CARE_PROVIDER_SITE_OTHER): Payer: Medicare Other | Admitting: Pulmonary Disease

## 2010-07-25 DIAGNOSIS — G4733 Obstructive sleep apnea (adult) (pediatric): Secondary | ICD-10-CM

## 2010-08-05 DIAGNOSIS — G4733 Obstructive sleep apnea (adult) (pediatric): Secondary | ICD-10-CM | POA: Insufficient documentation

## 2010-08-05 NOTE — Progress Notes (Signed)
The pt underwent home sleep testing with a type 3 monitoring device.  Airflow, saturations, effort, and heart rate were all monitored.  The pt had a flow evaluation period of 5h and .  Data and tracings have been reviewed with the following results:  1) 246 obstructive apneas, 2 central apneas, and 10 obstructive hypopneas for an AHI of 47/hr 2) desat to 82%, with the pt spending less than or equal to a sat of 88%.

## 2010-08-05 NOTE — Assessment & Plan Note (Signed)
The pt has severe osa, and will need treatment with cpap.  Will arrange for f/u visit to discuss results.

## 2010-08-09 ENCOUNTER — Encounter: Payer: Self-pay | Admitting: Pulmonary Disease

## 2010-08-12 ENCOUNTER — Ambulatory Visit (INDEPENDENT_AMBULATORY_CARE_PROVIDER_SITE_OTHER): Payer: Medicare Other | Admitting: Cardiovascular Disease

## 2010-08-12 ENCOUNTER — Encounter: Payer: Medicare Other | Admitting: Cardiovascular Disease

## 2010-08-12 DIAGNOSIS — I4891 Unspecified atrial fibrillation: Secondary | ICD-10-CM

## 2010-08-12 NOTE — Progress Notes (Signed)
Exercise Treadmill Test  Pre-Exercise Testing Evaluation Rhythm: normal sinus  Rate: 61   PR:  .20 QRS:  .20  QT:  .41 QTc: .41     Test  Exercise Tolerance Test Ordering MD: Charlton Haws, MD  Interpreting MD:  Charlton Haws, MD  Unique Test No: 1  Treadmill:  1  Indication for ETT: A-FIB  Contraindication to ETT: No   Stress Modality: exercise - treadmill  Cardiac Imaging Performed: non   Protocol: standard Bruce - maximal  Max BP: 189/71  Max MPHR (bpm):  147 85% HR 124  MPHR obtained (bpm):  148 % MPHR obtained:  100  5:22 7:00 TEX Time advanced two minute stages  Workload in METS:  11,7 Borg Scale: 17  Reason ETT Terminated:  fatigue    ST Segment Analysis At Rest: normal ST segments - no evidence of significant ST depression With Exercise: no evidence of significant ST depression  Other Information Arrhythmia:  No Angina during ETT:  absent (0) Quality of ETT:  diagnostic  ETT Interpretation:  normal - no evidence of ischemia by ST analysis  Comments: No arrythmia   Recommendations: Watch salt in diet

## 2010-08-13 ENCOUNTER — Encounter: Payer: Self-pay | Admitting: Pulmonary Disease

## 2010-08-13 ENCOUNTER — Ambulatory Visit (INDEPENDENT_AMBULATORY_CARE_PROVIDER_SITE_OTHER): Payer: Medicare Other | Admitting: Pulmonary Disease

## 2010-08-13 VITALS — BP 152/72 | HR 52 | Temp 97.4°F | Ht 72.0 in | Wt 186.2 lb

## 2010-08-13 DIAGNOSIS — G4733 Obstructive sleep apnea (adult) (pediatric): Secondary | ICD-10-CM

## 2010-08-13 NOTE — Patient Instructions (Signed)
Will start on cpap as a trial, and see you back in 5 weeks. Please call if having any issues with tolerance.

## 2010-08-13 NOTE — Progress Notes (Signed)
  Subjective:    Patient ID: Roy Obrien, male    DOB: 1935/07/22, 75 y.o.   MRN: [id number]  HPI The pt comes in today for f/u of his recent sleep study.  He surprisingly was found to have severe osa, with AHI 47/hr.  I have reviewed the study in detail with him, and answered all of his questions.     Review of Systems  Constitutional: Negative for fever and unexpected weight change.  HENT: Positive for sneezing. Negative for ear pain, nosebleeds, congestion, sore throat, rhinorrhea, trouble swallowing, dental problem, postnasal drip and sinus pressure.   Eyes: Negative for redness and itching.  Respiratory: Negative for cough, chest tightness, shortness of breath and wheezing.   Cardiovascular: Negative for palpitations and leg swelling.  Gastrointestinal: Negative for nausea and vomiting.  Genitourinary: Negative for dysuria.  Musculoskeletal: Negative for joint swelling.  Skin: Positive for rash.  Neurological: Negative for headaches.  Hematological: Does not bruise/bleed easily.  Psychiatric/Behavioral: Negative for dysphoric mood. The patient is not nervous/anxious.        Objective:   Physical Exam Wd male in nad No purulence or discharge noted from nares LE without edema, no cyanosis  Appears alert, moves all 4 extremities.        Assessment & Plan:

## 2010-08-13 NOTE — Assessment & Plan Note (Signed)
The pt surprisingly had severe osa on his sleep study, despite not being overly symptomatic.  I suspect he is having more issues than he realizes, given the severity of his sleep disordered breathing.  I have reviewed the various treatment options with him, but cpap gives him the best chance at complete treatment.  He is willing to give this a trial

## 2010-08-19 ENCOUNTER — Encounter: Payer: Self-pay | Admitting: Pulmonary Disease

## 2010-09-03 ENCOUNTER — Telehealth: Payer: Self-pay | Admitting: *Deleted

## 2010-09-03 NOTE — Telephone Encounter (Signed)
Voice mail.  Pt. Called asking to speak to a nurse.  Called him back and LMTCB.

## 2010-09-03 NOTE — Telephone Encounter (Signed)
Pt became dizzy on the Sertraline 50 mg, and stopped for about 4-5 days, and it got better.  He would like to have a change if possible, and Tylenol PM was suggested.  What does Dr. Clent Ridges think?

## 2010-09-04 NOTE — Telephone Encounter (Signed)
Informed pt of Dr. Charm Rings recommendations.

## 2010-09-04 NOTE — Telephone Encounter (Signed)
I agree, stay off Zoloft and instead try Tylenol PM at bedtime

## 2010-09-05 ENCOUNTER — Telehealth: Payer: Self-pay | Admitting: Pulmonary Disease

## 2010-09-05 NOTE — Telephone Encounter (Signed)
GAVE PT APPT. Roy Obrien

## 2010-09-06 ENCOUNTER — Encounter: Payer: Self-pay | Admitting: Pulmonary Disease

## 2010-09-06 ENCOUNTER — Ambulatory Visit (INDEPENDENT_AMBULATORY_CARE_PROVIDER_SITE_OTHER): Payer: Medicare Other | Admitting: Pulmonary Disease

## 2010-09-06 VITALS — BP 124/70 | HR 58 | Temp 97.4°F | Ht 72.0 in | Wt 188.4 lb

## 2010-09-06 DIAGNOSIS — G4733 Obstructive sleep apnea (adult) (pediatric): Secondary | ICD-10-CM

## 2010-09-06 NOTE — Progress Notes (Signed)
  Subjective:    Patient ID: SON BARKAN, male    DOB: 11-25-1935, 75 y.o.   MRN: [id number]  HPI The pt comes in today for discussion of his osa.  He was started on cpap at the last visit, and has had tolerance issues since.  He is having difficulties initiating sleep despite ramp, and has frequent awakenings.  He feels he cannot tolerate cpap, and it is not a viable therapy option for him    Review of Systems  Constitutional: Negative for fever and unexpected weight change.  HENT: Negative for ear pain, nosebleeds, congestion, sore throat, rhinorrhea, sneezing, trouble swallowing, dental problem, postnasal drip and sinus pressure.   Eyes: Negative for redness and itching.  Respiratory: Negative for cough, chest tightness, shortness of breath and wheezing.   Cardiovascular: Negative for palpitations and leg swelling.  Gastrointestinal: Negative for nausea and vomiting.  Genitourinary: Negative for dysuria.  Musculoskeletal: Negative for joint swelling.  Skin: Negative for rash.  Neurological: Negative for headaches.  Hematological: Does not bruise/bleed easily.  Psychiatric/Behavioral: Negative for dysphoric mood. The patient is not nervous/anxious.        Objective:   Physical Exam Wd male in nad No purulence or discharge from nares No LE edema, no cyanosis Alert and oriented, moves all 4        Assessment & Plan:

## 2010-09-06 NOTE — Patient Instructions (Signed)
Will refer to Dr. Althea Grimmer for consideration of a dental appliance Let me know if you would like to consider cpap again, and will work with you on tolerance/desensitization.

## 2010-09-06 NOTE — Assessment & Plan Note (Signed)
The pt has severe osa, as well as underlying atrial arrhythmia that would benefit from treatment.  He feels he is completely intolerant of cpap, and does not want to work on desensitization.  He has asked about other options, and I have discussed dental appliance with him.  I have explained that it is unlikely to totally treat his osa given its severity, but can treat enough to greatly decrease his AHI.  He would like to consider this, and I will make referral to dentistry.

## 2010-09-18 ENCOUNTER — Telehealth: Payer: Self-pay | Admitting: Pulmonary Disease

## 2010-09-18 DIAGNOSIS — G4733 Obstructive sleep apnea (adult) (pediatric): Secondary | ICD-10-CM

## 2010-09-18 NOTE — Telephone Encounter (Signed)
Order placed to d/c pt's cpap.  Pt aware order sent.

## 2010-09-18 NOTE — Telephone Encounter (Signed)
Yes, it is ok to d/c his cpap.

## 2010-09-18 NOTE — Telephone Encounter (Signed)
Per 5.18.12 ov with KC, a referral for a dental appliance was sent to pt's DME and pt was told that if he changes his mind on the CPAP and would like to try it again to call the office.  No order was sent to d/c the CPAP however.  Called spoke with patient to verify.  Pt states that he has been contacted about the dental appliance and is supposed to be contacted again.    KC, please verify that you would like the CPAP d/c'd and if okay to send the order.  Thanks.

## 2010-09-24 ENCOUNTER — Ambulatory Visit: Payer: Medicare Other | Admitting: Pulmonary Disease

## 2010-09-26 ENCOUNTER — Telehealth: Payer: Self-pay | Admitting: Pulmonary Disease

## 2010-09-26 NOTE — Telephone Encounter (Signed)
Order was sent to [facility] on 5/30 to d/c pt's cpap machine from [facility].  Pt states DME company still hasn't picked up CPAP.  Will forward message to [facility] to address.

## 2010-09-26 NOTE — Telephone Encounter (Signed)
Called [facility] and spoke with Jasmine December. Jasmine December stated that they had called pt on the [phone] and left message for pt to return his call. I advised Jasmine December to contact pt on his home number and she should be able to reach him. Jasmine December stated that she would call him now to arrange pick up of the cpap. Will contact pt on Friday to if [facility] has arranged for pick up.

## 2010-09-30 NOTE — Telephone Encounter (Signed)
Called and spoke with pt this am and [facility] has picked up the cpap equipment.

## 2011-02-24 ENCOUNTER — Ambulatory Visit (INDEPENDENT_AMBULATORY_CARE_PROVIDER_SITE_OTHER): Payer: Medicare Other | Admitting: Family Medicine

## 2011-02-24 ENCOUNTER — Encounter: Payer: Self-pay | Admitting: Family Medicine

## 2011-02-24 VITALS — BP 138/80 | HR 71 | Temp 97.7°F | Wt 188.0 lb

## 2011-02-24 DIAGNOSIS — R42 Dizziness and giddiness: Secondary | ICD-10-CM

## 2011-02-24 DIAGNOSIS — L309 Dermatitis, unspecified: Secondary | ICD-10-CM

## 2011-02-24 DIAGNOSIS — L259 Unspecified contact dermatitis, unspecified cause: Secondary | ICD-10-CM

## 2011-02-24 MED ORDER — TRIAMCINOLONE ACETONIDE 0.5 % EX CREA: TOPICAL_CREAM | Freq: Three times a day (TID) | CUTANEOUS | Status: DC

## 2011-02-24 MED ORDER — MECLIZINE HCL 25 MG PO TABS: 25.0000 mg | ORAL_TABLET | ORAL | Status: DC | PRN

## 2011-02-24 NOTE — Progress Notes (Signed)
  Subjective:    Patient ID: Roy Obrien, male    DOB: [birth year], 75 y.o.   MRN: [id number]  HPI Here for intermittent dizziness over the past few weeks which feel like the room is spinning. These tend to come when he moves his head quickly. Some nausea without vomiting. No HA. He has also had itching rashes over his arms, legs, and trunk for a month or so.    Review of Systems  Constitutional: Negative.   HENT: Positive for congestion and postnasal drip.   Respiratory: Negative.   Neurological: Positive for dizziness. Negative for seizures, syncope, speech difficulty, numbness and headaches.       Objective:   Physical Exam  Constitutional: He appears well-developed and well-nourished.  HENT:  Right Ear: External ear normal.  Left Ear: External ear normal.  Mouth/Throat: Oropharynx is clear and moist. No oropharyngeal exudate.  Eyes: Conjunctivae are normal. Pupils are equal, round, and reactive to light.  Neck: Neck supple. No thyromegaly present.  Lymphadenopathy:    He has no cervical adenopathy.  Neurological: He is alert. No cranial nerve deficit. Coordination normal.          Assessment & Plan:  For the vertigo, try daily Allegra for prevention, and try Meclizine for relief during episodes. Use triamcinolone for the skin.

## 2011-02-26 ENCOUNTER — Ambulatory Visit: Payer: Medicare Other | Admitting: Family Medicine

## 2011-04-08 ENCOUNTER — Encounter: Payer: Self-pay | Admitting: Gastroenterology

## 2011-12-10 ENCOUNTER — Ambulatory Visit (INDEPENDENT_AMBULATORY_CARE_PROVIDER_SITE_OTHER): Payer: Medicare Other | Admitting: Family Medicine

## 2011-12-10 ENCOUNTER — Encounter: Payer: Self-pay | Admitting: Family Medicine

## 2011-12-10 VITALS — BP 138/78 | HR 96 | Temp 98.0°F | Wt 184.0 lb

## 2011-12-10 DIAGNOSIS — H8110 Benign paroxysmal vertigo, unspecified ear: Secondary | ICD-10-CM

## 2011-12-10 DIAGNOSIS — I10 Essential (primary) hypertension: Secondary | ICD-10-CM

## 2011-12-10 MED ORDER — SILDENAFIL CITRATE 100 MG PO TABS: 50.0000 mg | ORAL_TABLET | Freq: Every day | ORAL | Status: DC | PRN

## 2011-12-10 NOTE — Progress Notes (Signed)
  Subjective:    Patient ID: Roy Obrien, male    DOB: 07/25/1935, 76 y.o.   MRN: [id number]  HPI Here to follow up on vertigo and HTN. He has done well but he gets dizzy at times. 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 No chest pains.    Review of Systems  Constitutional: Negative.   HENT: Negative.   Eyes: Negative.   Respiratory: Positive for shortness of breath. Negative for cough.   Cardiovascular: Negative.   Neurological: Positive for dizziness. Negative for tremors, seizures, syncope, facial asymmetry, speech difficulty, weakness, light-headedness, numbness and headaches.       Objective:   Physical Exam  Constitutional: He is oriented to person, place, and time. He appears well-developed and well-nourished.  HENT:  Head: Atraumatic.  Right Ear: External ear normal.  Left Ear: External ear normal.  Nose: Nose normal.  Mouth/Throat: Oropharynx is clear and moist.  Eyes: Conjunctivae and EOM are normal. Pupils are equal, round, and reactive to light.  Neck: No thyromegaly present.  Cardiovascular: Normal rate, regular rhythm, normal heart sounds and intact distal pulses.   Pulmonary/Chest: Effort normal and breath sounds normal.  Lymphadenopathy:    He has no cervical adenopathy.  Neurological: He is alert and oriented to person, place, and time.          Assessment & Plan:  His HTN is stable. Vertigo is stable. Use Meclizine prn

## 2011-12-12 ENCOUNTER — Telehealth: Payer: Self-pay | Admitting: Family Medicine

## 2011-12-12 NOTE — Telephone Encounter (Signed)
Patient came in today wanting to know when he was going to be referred to the dermatologist, I told him that [name] would contact him.  He also wanted to speak to Dr. Clent Ridges for 30 seconds in regards to a personal matter that he did NOT want to discuss. Can you please call him at the mobile number that is listed.  Thank you.

## 2011-12-12 NOTE — Telephone Encounter (Signed)
I left voice message on home phone to return my call. I tried the cell number and was told that it was the wrong number.

## 2012-01-19 ENCOUNTER — Encounter: Payer: Self-pay | Admitting: Gastroenterology

## 2012-03-03 ENCOUNTER — Telehealth: Payer: Self-pay | Admitting: Family Medicine

## 2012-03-03 NOTE — Telephone Encounter (Signed)
Your pt is requesting doctor fry to see his son in law as new pt(Roy Obrien [phone]) Can I sch? Pt has [facility] ins

## 2012-03-03 NOTE — Telephone Encounter (Signed)
Yes I can see him thanks  

## 2012-03-08 NOTE — Telephone Encounter (Signed)
lmom for pt to call back

## 2012-03-10 NOTE — Telephone Encounter (Signed)
lmom for Roy Obrien to callback

## 2012-03-10 NOTE — Telephone Encounter (Signed)
Son in law has been sch

## 2012-04-15 ENCOUNTER — Encounter: Payer: Medicare Other | Admitting: Family Medicine

## 2012-05-18 ENCOUNTER — Encounter: Payer: Medicare Other | Admitting: Family Medicine

## 2012-05-20 ENCOUNTER — Ambulatory Visit (INDEPENDENT_AMBULATORY_CARE_PROVIDER_SITE_OTHER): Payer: Medicare Other | Admitting: Family Medicine

## 2012-05-20 ENCOUNTER — Encounter: Payer: Self-pay | Admitting: Family Medicine

## 2012-05-20 VITALS — BP 130/82 | HR 71 | Temp 97.9°F | Ht 72.25 in | Wt 180.0 lb

## 2012-05-20 DIAGNOSIS — Z0000 Encounter for general adult medical examination without abnormal findings: Secondary | ICD-10-CM

## 2012-05-20 LAB — BASIC METABOLIC PANEL
BUN: 19 mg/dL (ref 6–23)
CO2: 29 mEq/L (ref 19–32)
Calcium: 9.5 mg/dL (ref 8.4–10.5)
Creatinine, Ser: 1 mg/dL (ref 0.4–1.5)
GFR: 79.28 mL/min (ref >60.00)
Glucose, Bld: 99 mg/dL (ref 70–99)

## 2012-05-20 LAB — POCT URINALYSIS DIPSTICK
Glucose, UA: NEGATIVE
Leukocytes, UA: NEGATIVE
Nitrite, UA: NEGATIVE
Protein, UA: NEGATIVE
Spec Grav, UA: 1.02
Urobilinogen, UA: 0.2

## 2012-05-20 LAB — HEPATIC FUNCTION PANEL
ALT: 15 U/L (ref 0–53)
Albumin: 4.3 g/dL (ref 3.5–5.2)
Total Protein: 7.1 g/dL (ref 6.0–8.3)

## 2012-05-20 LAB — LIPID PANEL
Cholesterol: 229 mg/dL — ABNORMAL HIGH (ref 0–200)
HDL: 61.1 mg/dL (ref >39.00)
Triglycerides: 53 mg/dL (ref 0.0–149.0)

## 2012-05-20 LAB — LDL CHOLESTEROL, DIRECT: Direct LDL: 142.6 mg/dL

## 2012-05-20 LAB — CBC WITH DIFFERENTIAL/PLATELET
Basophils Absolute: 0 10*3/uL (ref 0.0–0.1)
HCT: 48.3 % (ref 39.0–52.0)
Lymphs Abs: 1 10*3/uL (ref 0.7–4.0)
Monocytes Relative: 7.8 % (ref 3.0–12.0)
Neutrophils Relative %: 70.4 % (ref 43.0–77.0)
Platelets: 209 10*3/uL (ref 150.0–400.0)
RDW: 13.1 % (ref 11.5–14.6)

## 2012-05-20 LAB — TSH: TSH: 0.78 u[IU]/mL (ref 0.35–5.50)

## 2012-05-20 MED ORDER — MECLIZINE HCL 25 MG PO TABS: 25.0000 mg | ORAL_TABLET | ORAL | Status: AC | PRN

## 2012-05-20 NOTE — Progress Notes (Signed)
  Subjective:    Patient ID: Roy Obrien, male    DOB: Oct 25, 1935, 77 y.o.   MRN: [id number]  HPI 77 yr old male for a cpx. He feels well in general. He has been under a lot of stress with financial and family issues, but he thinks he is dealing with this fairly well.    Review of Systems  Constitutional: Negative.   HENT: Negative.   Eyes: Negative.   Respiratory: Negative.   Cardiovascular: Negative.   Gastrointestinal: Negative.   Genitourinary: Negative.   Musculoskeletal: Negative.   Skin: Negative.   Neurological: Negative.   Hematological: Negative.   Psychiatric/Behavioral: Negative.        Objective:   Physical Exam  Constitutional: He is oriented to person, place, and time. He appears well-developed and well-nourished. No distress.  HENT:  Head: Normocephalic and atraumatic.  Right Ear: External ear normal.  Left Ear: External ear normal.  Nose: Nose normal.  Mouth/Throat: Oropharynx is clear and moist. No oropharyngeal exudate.  Eyes: Conjunctivae normal and EOM are normal. Pupils are equal, round, and reactive to light. Right eye exhibits no discharge. Left eye exhibits no discharge. No scleral icterus.  Neck: Neck supple. No JVD present. No tracheal deviation present. No thyromegaly present.  Cardiovascular: Normal rate, regular rhythm, normal heart sounds and intact distal pulses.  Exam reveals no gallop and no friction rub.   No murmur heard.      EKG normal  Pulmonary/Chest: Effort normal and breath sounds normal. No respiratory distress. He has no wheezes. He has no rales. He exhibits no tenderness.  Abdominal: Soft. Bowel sounds are normal. He exhibits no distension and no mass. There is no tenderness. There is no rebound and no guarding.  Genitourinary: Rectum normal, prostate normal and penis normal. Guaiac negative stool. No penile tenderness.  Musculoskeletal: Normal range of motion. He exhibits no edema and no tenderness.  Lymphadenopathy:    He  has no cervical adenopathy.  Neurological: He is alert and oriented to person, place, and time. He has normal reflexes. No cranial nerve deficit. He exhibits normal muscle tone. Coordination normal.  Skin: Skin is warm and dry. No rash noted. He is not diaphoretic. No erythema. No pallor.  Psychiatric: He has a normal mood and affect. His behavior is normal. Judgment and thought content normal.          Assessment & Plan:  Well exam. Get fasting labs.

## 2012-05-24 ENCOUNTER — Encounter: Payer: Self-pay | Admitting: Family Medicine

## 2012-05-24 ENCOUNTER — Telehealth: Payer: Self-pay | Admitting: Family Medicine

## 2012-05-24 NOTE — Telephone Encounter (Signed)
Pt was here on 05/20/12 and he forgot to ask about taking something to help with anxiety. He is going through a very stressful time right now and feels that taking a low dose of anxiety medication would help him.

## 2012-05-24 NOTE — Progress Notes (Signed)
Quick Note:  I tried to reach pt by phone, no answer. I put a copy of results in mail to pt. ______

## 2012-05-25 MED ORDER — SERTRALINE HCL 50 MG PO TABS: 50.0000 mg | ORAL_TABLET | Freq: Every day | ORAL | Status: DC

## 2012-05-25 NOTE — Telephone Encounter (Signed)
Call in Zoloft 50 mg a day, #30 with 2 rf. Have him see me in 2-3 weeks to follow up on this

## 2012-05-25 NOTE — Telephone Encounter (Signed)
I sent script e-scribe and spoke with pt. 

## 2012-06-04 ENCOUNTER — Ambulatory Visit: Payer: Medicare Other | Admitting: Family Medicine

## 2012-06-10 ENCOUNTER — Encounter: Payer: Self-pay | Admitting: Family Medicine

## 2012-06-10 ENCOUNTER — Ambulatory Visit (INDEPENDENT_AMBULATORY_CARE_PROVIDER_SITE_OTHER): Payer: Medicare Other | Admitting: Family Medicine

## 2012-06-10 VITALS — BP 126/76 | HR 63 | Temp 97.7°F | Wt 182.0 lb

## 2012-06-10 DIAGNOSIS — F411 Generalized anxiety disorder: Secondary | ICD-10-CM

## 2012-06-10 MED ORDER — ESCITALOPRAM OXALATE 10 MG PO TABS: 10.0000 mg | ORAL_TABLET | Freq: Every day | ORAL | Status: DC

## 2012-06-10 NOTE — Progress Notes (Signed)
  Subjective:    Patient ID: Roy Obrien, male    DOB: [birth year], 77 y.o.   MRN: [id number]  HPI Here to follow up on stress. We had prescribed him Zoloft several weeks ago but when he read the list of possible side effects at the pharmacy, he decided to not take it. He is still quite stressed although this week he and his wife are moving out of their daughter's home into their own apartment. He asks if there is another similar med to try.    Review of Systems  Constitutional: Negative.   Psychiatric/Behavioral: Negative for behavioral problems, confusion, dysphoric mood, decreased concentration and agitation. The patient is nervous/anxious.        Objective:   Physical Exam  Constitutional: He appears well-developed and well-nourished.  Psychiatric: He has a normal mood and affect. His behavior is normal. Thought content normal.          Assessment & Plan:  We decided to try Lexapro. Recheck in 2-3 weeks

## 2012-06-16 ENCOUNTER — Ambulatory Visit (INDEPENDENT_AMBULATORY_CARE_PROVIDER_SITE_OTHER): Payer: Medicare Other | Admitting: Family Medicine

## 2012-06-16 ENCOUNTER — Encounter: Payer: Self-pay | Admitting: Family Medicine

## 2012-06-16 ENCOUNTER — Ambulatory Visit: Payer: Medicare Other | Admitting: Family Medicine

## 2012-06-16 VITALS — BP 138/80 | HR 59 | Temp 97.7°F | Wt 178.0 lb

## 2012-06-16 DIAGNOSIS — F411 Generalized anxiety disorder: Secondary | ICD-10-CM

## 2012-06-16 NOTE — Progress Notes (Signed)
  Subjective:    Patient ID: Roy Obrien, male    DOB: [birth year], 77 y.o.   MRN: [id number]  HPI Here to follow up on anxiety. One week ago we started him on Lexapro, and so far he thinks it is helping. No side effects. 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 He is sleeping better.    Review of Systems  Constitutional: Negative.   Psychiatric/Behavioral: Negative for behavioral problems, confusion, dysphoric mood, decreased concentration and agitation. The patient is nervous/anxious.        Objective:   Physical Exam  Constitutional: He appears well-developed and well-nourished.  Psychiatric: He has a normal mood and affect. His behavior is normal. Thought content normal.          Assessment & Plan:  His anxiety seems to be responding well to Lexapro. We will have a better idea of this however in another 2-3 weeks. Follow up then.

## 2012-10-01 ENCOUNTER — Other Ambulatory Visit: Payer: Self-pay | Admitting: Family Medicine

## 2013-03-28 ENCOUNTER — Ambulatory Visit (INDEPENDENT_AMBULATORY_CARE_PROVIDER_SITE_OTHER): Payer: Medicare Other | Admitting: Family Medicine

## 2013-03-28 ENCOUNTER — Encounter: Payer: Self-pay | Admitting: Family Medicine

## 2013-03-28 VITALS — BP 160/88 | HR 79 | Temp 98.8°F | Wt 190.0 lb

## 2013-03-28 DIAGNOSIS — L509 Urticaria, unspecified: Secondary | ICD-10-CM

## 2013-03-28 DIAGNOSIS — F411 Generalized anxiety disorder: Secondary | ICD-10-CM

## 2013-03-28 NOTE — Progress Notes (Signed)
Pre visit review using our clinic review tool, if applicable. No additional management support is needed unless otherwise documented below in the visit note. 

## 2013-03-28 NOTE — Progress Notes (Signed)
   Subjective:    Patient ID: Roy Obrien, male    DOB: March 07, 1936, 77 y.o.   MRN: [id number]  HPI Here for an itchy rash on both forearms and the neck that started 3 weeks ago. He has been taking Benadryl and the rash is subsiding. No SOB or wheezing. No new meds recently but he has been under a lot of stress.    Review of Systems  Constitutional: Negative.   Skin: Positive for rash.  Psychiatric/Behavioral: Negative for dysphoric mood and decreased concentration. The patient is nervous/anxious.        Objective:   Physical Exam  Constitutional: He is oriented to person, place, and time. He appears well-developed and well-nourished. No distress.  Cardiovascular: Normal rate, regular rhythm, normal heart sounds and intact distal pulses.   Pulmonary/Chest: Effort normal and breath sounds normal.  Neurological: He is alert and oriented to person, place, and time.  Skin:  Faint erythematous papules and wheals on the back of the neck and both forearms.           Assessment & Plan:  This is hives, probably brought on by stress. He will continue Benadryl. If the rash gets worse again we may consider using prednisone.

## 2013-04-18 ENCOUNTER — Other Ambulatory Visit: Payer: Medicare Other

## 2013-04-25 ENCOUNTER — Encounter: Payer: Medicare Other | Admitting: Family Medicine

## 2013-05-16 ENCOUNTER — Other Ambulatory Visit: Payer: Medicare Other

## 2013-05-18 ENCOUNTER — Other Ambulatory Visit (INDEPENDENT_AMBULATORY_CARE_PROVIDER_SITE_OTHER): Payer: Medicare Other

## 2013-05-18 DIAGNOSIS — I10 Essential (primary) hypertension: Secondary | ICD-10-CM

## 2013-05-18 DIAGNOSIS — Z0000 Encounter for general adult medical examination without abnormal findings: Secondary | ICD-10-CM

## 2013-05-18 LAB — CBC WITH DIFFERENTIAL/PLATELET
BASOS PCT: 0.6 % (ref 0.0–3.0)
Basophils Absolute: 0 10*3/uL (ref 0.0–0.1)
EOS PCT: 6.5 % — AB (ref 0.0–5.0)
Eosinophils Absolute: 0.3 10*3/uL (ref 0.0–0.7)
HCT: 48.1 % (ref 39.0–52.0)
Hemoglobin: 15.8 g/dL (ref 13.0–17.0)
LYMPHS PCT: 25.4 % (ref 12.0–46.0)
Lymphs Abs: 1.2 10*3/uL (ref 0.7–4.0)
MCHC: 32.8 g/dL (ref 30.0–36.0)
MCV: 99.1 fl (ref 78.0–100.0)
MONO ABS: 0.3 10*3/uL (ref 0.1–1.0)
MONOS PCT: 7.5 % (ref 3.0–12.0)
NEUTROS PCT: 60 % (ref 43.0–77.0)
Neutro Abs: 2.8 10*3/uL (ref 1.4–7.7)
Platelets: 213 10*3/uL (ref 150.0–400.0)
RBC: 4.86 Mil/uL (ref 4.22–5.81)
RDW: 13.5 % (ref 11.5–14.6)
WBC: 4.6 10*3/uL (ref 4.5–10.5)

## 2013-05-18 LAB — LIPID PANEL
Cholesterol: 224 mg/dL — ABNORMAL HIGH (ref 0–200)
HDL: 58.4 mg/dL (ref >39.00)
TRIGLYCERIDES: 49 mg/dL (ref 0.0–149.0)
Total CHOL/HDL Ratio: 4
VLDL: 9.8 mg/dL (ref 0.0–40.0)

## 2013-05-18 LAB — BASIC METABOLIC PANEL
BUN: 19 mg/dL (ref 6–23)
CHLORIDE: 108 meq/L (ref 96–112)
CO2: 31 mEq/L (ref 19–32)
CREATININE: 1.1 mg/dL (ref 0.4–1.5)
Calcium: 9.8 mg/dL (ref 8.4–10.5)
GFR: 69.94 mL/min (ref >60.00)
Glucose, Bld: 102 mg/dL — ABNORMAL HIGH (ref 70–99)
POTASSIUM: 5.9 meq/L — AB (ref 3.5–5.1)
Sodium: 145 mEq/L (ref 135–145)

## 2013-05-18 LAB — POCT URINALYSIS DIPSTICK
BILIRUBIN UA: NEGATIVE
GLUCOSE UA: NEGATIVE
KETONES UA: NEGATIVE
Leukocytes, UA: NEGATIVE
Nitrite, UA: NEGATIVE
PH UA: 6
Protein, UA: NEGATIVE
RBC UA: NEGATIVE
SPEC GRAV UA: 1.02
Urobilinogen, UA: 0.2

## 2013-05-18 LAB — PSA, MEDICARE: PSA: 1.68 ng/ml (ref 0.10–4.00)

## 2013-05-18 LAB — HEPATIC FUNCTION PANEL
ALK PHOS: 51 U/L (ref 39–117)
ALT: 15 U/L (ref 0–53)
AST: 21 U/L (ref 0–37)
Albumin: 4.2 g/dL (ref 3.5–5.2)
BILIRUBIN DIRECT: 0.1 mg/dL (ref 0.0–0.3)
TOTAL PROTEIN: 6.8 g/dL (ref 6.0–8.3)
Total Bilirubin: 1.1 mg/dL (ref 0.3–1.2)

## 2013-05-18 LAB — LDL CHOLESTEROL, DIRECT: LDL DIRECT: 154.8 mg/dL

## 2013-05-18 LAB — TSH: TSH: 1.59 u[IU]/mL (ref 0.35–5.50)

## 2013-05-23 ENCOUNTER — Encounter: Payer: Self-pay | Admitting: Family Medicine

## 2013-05-23 ENCOUNTER — Ambulatory Visit (INDEPENDENT_AMBULATORY_CARE_PROVIDER_SITE_OTHER): Payer: Medicare Other | Admitting: Family Medicine

## 2013-05-23 VITALS — BP 146/80 | HR 77 | Temp 98.6°F | Ht 72.0 in | Wt 186.0 lb

## 2013-05-23 DIAGNOSIS — Z0000 Encounter for general adult medical examination without abnormal findings: Secondary | ICD-10-CM

## 2013-05-23 DIAGNOSIS — I10 Essential (primary) hypertension: Secondary | ICD-10-CM

## 2013-05-23 NOTE — Progress Notes (Signed)
   Subjective:    Patient ID: Roy Obrien, male    DOB: 20-Nov-1935, 78 y.o.   MRN: [id number]  HPI 78 yr old male for a cpx. He feels well.    Review of Systems  Constitutional: Negative.   HENT: Negative.   Eyes: Negative.   Respiratory: Negative.   Cardiovascular: Negative.   Gastrointestinal: Negative.   Genitourinary: Negative.   Musculoskeletal: Negative.   Skin: Negative.   Neurological: Negative.   Psychiatric/Behavioral: Negative.        Objective:   Physical Exam  Constitutional: He is oriented to person, place, and time. He appears well-developed and well-nourished. No distress.  HENT:  Head: Normocephalic and atraumatic.  Right Ear: External ear normal.  Left Ear: External ear normal.  Nose: Nose normal.  Mouth/Throat: Oropharynx is clear and moist. No oropharyngeal exudate.  Eyes: Conjunctivae and EOM are normal. Pupils are equal, round, and reactive to light. Right eye exhibits no discharge. Left eye exhibits no discharge. No scleral icterus.  Neck: Neck supple. No JVD present. No tracheal deviation present. No thyromegaly present.  Cardiovascular: Normal rate, regular rhythm, normal heart sounds and intact distal pulses.  Exam reveals no gallop and no friction rub.   No murmur heard. EKG normal   Pulmonary/Chest: Effort normal and breath sounds normal. No respiratory distress. He has no wheezes. He has no rales. He exhibits no tenderness.  Abdominal: Soft. Bowel sounds are normal. He exhibits no distension and no mass. There is no tenderness. There is no rebound and no guarding.  Genitourinary: Rectum normal, prostate normal and penis normal. Guaiac negative stool. No penile tenderness.  Musculoskeletal: Normal range of motion. He exhibits no edema and no tenderness.  Lymphadenopathy:    He has no cervical adenopathy.  Neurological: He is alert and oriented to person, place, and time. He has normal reflexes. No cranial nerve deficit. He exhibits normal  muscle tone. Coordination normal.  Skin: Skin is warm and dry. No rash noted. He is not diaphoretic. No erythema. No pallor.  Psychiatric: He has a normal mood and affect. His behavior is normal. Judgment and thought content normal.          Assessment & Plan:  Well exam. Set up a colonoscopy.

## 2013-05-23 NOTE — Progress Notes (Signed)
Pre visit review using our clinic review tool, if applicable. No additional management support is needed unless otherwise documented below in the visit note. 

## 2013-05-24 ENCOUNTER — Telehealth: Payer: Self-pay | Admitting: Family Medicine

## 2013-05-24 NOTE — Telephone Encounter (Signed)
Relevant patient education mailed to patient.  

## 2013-05-27 ENCOUNTER — Encounter: Payer: Self-pay | Admitting: Gastroenterology

## 2013-06-23 ENCOUNTER — Encounter: Payer: Self-pay | Admitting: Family Medicine

## 2013-06-23 ENCOUNTER — Ambulatory Visit (INDEPENDENT_AMBULATORY_CARE_PROVIDER_SITE_OTHER): Payer: Medicare Other | Admitting: Family Medicine

## 2013-06-23 VITALS — BP 144/76 | HR 81 | Temp 98.3°F | Ht 72.0 in | Wt 188.0 lb

## 2013-06-23 DIAGNOSIS — IMO0002 Reserved for concepts with insufficient information to code with codable children: Secondary | ICD-10-CM

## 2013-06-23 DIAGNOSIS — Z23 Encounter for immunization: Secondary | ICD-10-CM

## 2013-06-23 DIAGNOSIS — S60519A Abrasion of unspecified hand, initial encounter: Secondary | ICD-10-CM

## 2013-06-23 MED ORDER — SILVER SULFADIAZINE 1 % EX CREA: 1.0000 "application " | TOPICAL_CREAM | Freq: Two times a day (BID) | CUTANEOUS | Status: DC

## 2013-06-23 NOTE — Addendum Note (Signed)
Addended by: Aggie Hacker A on: 06/23/2013 12:26 PM   Modules accepted: Orders

## 2013-06-23 NOTE — Progress Notes (Signed)
   Subjective:    Patient ID: Roy Obrien, male    DOB: 11-22-1935, 78 y.o.   MRN: [id number]  HPI Here for abrasions to both hands. First he scraped the right hand on a shelf about 8 days ago, and this seems to be healing well. Then 5 days ago he struck the left hand on something, taking odd the top layer of skin. He has been applying Neosporin but it is not healing well and is tender.   Review of Systems  Constitutional: Negative.   Skin: Positive for wound.       Objective:   Physical Exam  Constitutional: He appears well-developed and well-nourished.  Skin:  The wound on the dorsal right hand is healing well and not tender. The wound on the dorsal left hand is about 1.2 cm in diameter, it is tender, and there is a tiny bit of granulation tissue visible. It does not appear to be infected.           Assessment & Plan:  He will dress this bid with Silvadene.

## 2013-06-23 NOTE — Progress Notes (Signed)
Pre visit review using our clinic review tool, if applicable. No additional management support is needed unless otherwise documented below in the visit note. 

## 2013-07-28 ENCOUNTER — Encounter: Payer: Medicare Other | Admitting: Gastroenterology

## 2013-09-20 ENCOUNTER — Encounter: Payer: Self-pay | Admitting: Family Medicine

## 2013-09-20 ENCOUNTER — Ambulatory Visit (INDEPENDENT_AMBULATORY_CARE_PROVIDER_SITE_OTHER): Payer: Medicare Other | Admitting: Family Medicine

## 2013-09-20 VITALS — BP 143/83 | HR 67 | Temp 98.6°F | Ht 72.0 in | Wt 185.0 lb

## 2013-09-20 DIAGNOSIS — L259 Unspecified contact dermatitis, unspecified cause: Secondary | ICD-10-CM

## 2013-09-20 MED ORDER — MECLIZINE HCL 25 MG PO TABS: 25.0000 mg | ORAL_TABLET | ORAL | Status: DC | PRN

## 2013-09-20 MED ORDER — PREDNISONE 10 MG PO TABS: ORAL_TABLET | ORAL | Status: DC

## 2013-09-20 NOTE — Progress Notes (Signed)
   Subjective:    Patient ID: Roy Obrien, male    DOB: [birth year], 78 y.o.   MRN: [id number]  HPI Here for 3 weeks of poison ivy rash on both arms, it itches and burns. Using Benadryl and Caladryl lotion.    Review of Systems  Constitutional: Negative.   Skin: Positive for rash.       Objective:   Physical Exam  Constitutional: He appears well-developed and well-nourished.  Skin:  Red maculovesicular rash on both arms          Assessment & Plan:  Given a prednisone taper. Add Zyrtec and Zantac OTC

## 2013-09-20 NOTE — Progress Notes (Signed)
Pre visit review using our clinic review tool, if applicable. No additional management support is needed unless otherwise documented below in the visit note. 

## 2013-10-19 ENCOUNTER — Encounter: Payer: Self-pay | Admitting: Physician Assistant

## 2013-10-19 ENCOUNTER — Ambulatory Visit (INDEPENDENT_AMBULATORY_CARE_PROVIDER_SITE_OTHER): Payer: Medicare Other | Admitting: Physician Assistant

## 2013-10-19 VITALS — BP 140/82 | HR 60 | Temp 97.9°F | Resp 18 | Wt 187.0 lb

## 2013-10-19 DIAGNOSIS — L259 Unspecified contact dermatitis, unspecified cause: Secondary | ICD-10-CM

## 2013-10-19 MED ORDER — PREDNISONE 10 MG PO TABS: ORAL_TABLET | ORAL | Status: DC

## 2013-10-19 NOTE — Progress Notes (Signed)
Pre visit review using our clinic review tool, if applicable. No additional management support is needed unless otherwise documented below in the visit note. 

## 2013-10-19 NOTE — Patient Instructions (Signed)
Prednisone taper as directed.  Continue the topical creams for symptoms relief.  Cool compresses also relieve itching.  If emergency symptoms discussed during visit developed, seek medical attention immediately.  Followup as needed, or for worsening or persistent symptoms despite treatment.   Contact Dermatitis Contact dermatitis is a rash that happens when something touches the skin. You touched something that irritates your skin, or you have allergies to something you touched. HOME CARE   Avoid the thing that caused your rash.  Keep your rash away from hot water, soap, sunlight, chemicals, and other things that might bother it.  Do not scratch your rash.  You can take cool baths to help stop itching.  Only take medicine as told by your doctor.  Keep all doctor visits as told. GET HELP RIGHT AWAY IF:   Your rash is not better after 3 days.  Your rash gets worse.  Your rash is puffy (swollen), tender, red, sore, or warm.  You have problems with your medicine. MAKE SURE YOU:   Understand these instructions.  Will watch your condition.  Will get help right away if you are not doing well or get worse. Document Released: 02/02/2009 Document Revised: 06/30/2011 Document Reviewed: 09/10/2010 [facility] Patient Information 2015 [place], [place]. This information is not intended to replace advice given to you by your health care provider. Make sure you discuss any questions you have with your health care provider.

## 2013-10-19 NOTE — Progress Notes (Signed)
Subjective:    Patient ID: Roy Obrien, male    DOB: 1936/01/05, 78 y.o.   MRN: [id number]  Rash This is a new problem. The current episode started 1 to 4 weeks ago. The problem is unchanged. The affected locations include the back. The rash is characterized by itchiness and redness. Associated with: poison ivy several weeks ago, successfully treated with prednisone. Pertinent negatives include no anorexia, congestion, cough, diarrhea, eye pain, facial edema, fatigue, fever, joint pain, nail changes, rhinorrhea, shortness of breath, sore throat or vomiting. Past treatments include topical steroids and anti-itch cream. The treatment provided mild (itching feels better when the cream is on) relief. His past medical history is significant for allergies and varicella (never had shingles). There is no history of asthma or eczema.      Review of Systems  Constitutional: Negative for fever, chills and fatigue.  HENT: Negative for congestion, rhinorrhea and sore throat.   Eyes: Negative for pain.  Respiratory: Negative for cough and shortness of breath.   Gastrointestinal: Negative for nausea, vomiting, diarrhea and anorexia.  Musculoskeletal: Negative for joint pain.  Skin: Positive for rash. Negative for nail changes.  All other systems reviewed and are negative.  Past Medical History  Diagnosis Date  . Glaucoma     dr Roy Obrien, [place]  . Hyperlipidemia   . Hypertension   . Allergy   . Cataract   . ALLERGIC RHINITIS   . Cataract   . Anxiety   . Vertigo, benign positional     History   Social History  . Marital Status: Single    Spouse Name: married    Number of Children: y  . Years of Education: N/A   Occupational History  . insurance company     retired   Social History Main Topics  . Smoking status: Never Smoker   . Smokeless tobacco: Never Used  . Alcohol Use: No  . Drug Use: No  . Sexual Activity: Not on file   Other Topics Concern  . Not on file    Social History Narrative  . No narrative on file    Past Surgical History  Procedure Laterality Date  . Foot ablation      right foot nerve  . Mohs surgery  09-03-06    basal cell carcinoma right ear lobe - dr Roy Obrien  . Colonoscopy  03-18-06    per Dr. Fuller Obrien, repeat in 5 yrs     Family History  Problem Relation Age of Onset  . Breast cancer Mother   . Stroke Father   . Coronary artery disease Mother     deceased  . Emphysema Father     deceased    Allergies  Allergen Reactions  . Aspirin     Current Outpatient Prescriptions on File Prior to Visit  Medication Sig Dispense Refill  . meclizine (ANTIVERT) 25 MG tablet Take 1 tablet (25 mg total) by mouth every 4 (four) hours as needed for dizziness.  30 tablet  11  . predniSONE (DELTASONE) 10 MG tablet Take 4 tabs a day for 4 days, then 3 a day for 4 days, then 2 a day for 4 days, then 1 a day for 4 days, then stop  60 tablet  0  . sildenafil (VIAGRA) 100 MG tablet Take 0.5-1 tablets (50-100 mg total) by mouth daily as needed for erectile dysfunction.  10 tablet  11  . silver sulfADIAZINE (SILVADENE) 1 % cream Apply 1 application topically 2 (two)  times daily.  30 g  2   No current facility-administered medications on file prior to visit.    EXAM: BP 140/82  Pulse 60  Temp(Src) 97.9 F (36.6 C) (Oral)  Resp 18  Wt 187 lb (84.823 kg)     Objective:   Physical Exam  Nursing note and vitals reviewed. Constitutional: He is oriented to person, place, and time. He appears well-developed and well-nourished. No distress.  HENT:  Head: Normocephalic and atraumatic.  Eyes: Conjunctivae and EOM are normal. Pupils are equal, round, and reactive to light.  Neck: Normal range of motion.  Cardiovascular: Normal rate, regular rhythm and intact distal pulses.   Pulmonary/Chest: Effort normal and breath sounds normal. No respiratory distress. He exhibits no tenderness.  Musculoskeletal: Normal range of motion.   Neurological: He is alert and oriented to person, place, and time.  Skin: Skin is warm and dry. Rash noted. He is not diaphoretic. No erythema. No pallor.  Rash includes many small red lesions on bilateral mid back. Excoriations are present throughout. No sign of blistering. No increased swelling or surrounding erythema, not TTP, no fluctuance or excessive warmth.  Psychiatric: He has a normal mood and affect. His behavior is normal. Judgment and thought content normal.    Lab Results  Component Value Date   WBC 4.6 05/18/2013   HGB 15.8 05/18/2013   HCT 48.1 05/18/2013   PLT 213.0 05/18/2013   GLUCOSE 102* 05/18/2013   CHOL 224* 05/18/2013   TRIG 49.0 05/18/2013   HDL 58.40 05/18/2013   LDLDIRECT 154.8 05/18/2013   LDLCALC  Value: 131        Total Cholesterol/HDL:CHD Risk Coronary Heart Disease Risk Table                     Men   Women  1/2 Average Risk   3.4   3.3  Average Risk       5.0   4.4  2 X Average Risk   9.6   7.1  3 X Average Risk  23.4   11.0        Use the calculated Patient Ratio above and the CHD Risk Table to determine the patient's CHD Risk.        ATP III CLASSIFICATION (LDL):  <100     mg/dL   Optimal  100-129  mg/dL   Near or Above                    Optimal  130-159  mg/dL   Borderline  160-189  mg/dL   High  >190     mg/dL   Very High* 06/21/2010   ALT 15 05/18/2013   AST 21 05/18/2013   NA 145 05/18/2013   K 5.9* 05/18/2013   CL 108 05/18/2013   CREATININE 1.1 05/18/2013   BUN 19 05/18/2013   CO2 31 05/18/2013   TSH 1.59 05/18/2013   PSA 1.68 05/18/2013   HGBA1C  Value: 5.8 (NOTE)                                                                       According to the ADA Clinical Practice Recommendations for 2011, when HbA1c is used as a screening  test:   >=6.5%   Diagnostic of Diabetes Mellitus           (if abnormal result  is confirmed)  5.7-6.4%   Increased risk of developing Diabetes Mellitus  References:Diagnosis and Classification of Diabetes Mellitus,Diabetes  UMPN,3614,43(XVQMG 1):S62-S69 and Standards of Medical Care in         Diabetes - 2011,Diabetes QQPY,1950,93  (Suppl 1):S11-S61.* 06/21/2010         Assessment & Obrien:  Roy Obrien was seen today for spot on back.  Diagnoses and associated orders for this visit:  Contact dermatitis Comments: Unknown cause, possily poison ivy related due to recent exposure. Will continue steroid creams, and add prednisone taper. - predniSONE (DELTASONE) 10 MG tablet; Take 4 tabs a day for 4 days, then 3 a day for 4 days, then 2 a day for 4 days, then 1 a day for 4 days, then stop    Pt will continue topical steroid cream. Continue monitoring for signs of improvement.  Return precautions provided, and patient handout on contact dermatitis.  Obrien to follow up as needed, or for worsening or persistent symptoms despite treatment.  Patient Instructions  Prednisone taper as directed.  Continue the topical creams for symptoms relief.  Cool compresses also relieve itching.  If emergency symptoms discussed during visit developed, seek medical attention immediately.  Followup as needed, or for worsening or persistent symptoms despite treatment.

## 2014-01-09 ENCOUNTER — Telehealth: Payer: Self-pay | Admitting: Family Medicine

## 2014-01-09 ENCOUNTER — Encounter: Payer: Self-pay | Admitting: Family Medicine

## 2014-01-09 ENCOUNTER — Ambulatory Visit (INDEPENDENT_AMBULATORY_CARE_PROVIDER_SITE_OTHER): Payer: Medicare Other | Admitting: Family Medicine

## 2014-01-09 VITALS — BP 141/81 | HR 68 | Temp 98.1°F | Ht 72.0 in | Wt 189.0 lb

## 2014-01-09 DIAGNOSIS — L259 Unspecified contact dermatitis, unspecified cause: Secondary | ICD-10-CM

## 2014-01-09 MED ORDER — BETAMETHASONE DIPROPIONATE AUG 0.05 % EX CREA: TOPICAL_CREAM | Freq: Two times a day (BID) | CUTANEOUS | Status: DC

## 2014-01-09 MED ORDER — METHYLPREDNISOLONE 4 MG PO KIT: PACK | ORAL | Status: DC

## 2014-01-09 NOTE — Progress Notes (Signed)
Pre visit review using our clinic review tool, if applicable. No additional management support is needed unless otherwise documented below in the visit note. 

## 2014-01-09 NOTE — Telephone Encounter (Signed)
I spoke with pt and he is coming in today to see Dr. Sarajane Jews for rash on his back.

## 2014-01-09 NOTE — Progress Notes (Signed)
   Subjective:    Patient ID: Roy Obrien, male    DOB: 15-Jun-1935, 78 y.o.   MRN: [id number]  HPI Here for a week of severe eczema flare ups. The hot weather and sweating always make this worse. He itches around the back and chest and abdomen. Using triamcinolone cream with mixed results.    Review of Systems  Constitutional: Negative.   Skin: Positive for rash.       Objective:   Physical Exam  Constitutional: He appears well-developed and well-nourished.  Skin:  Patches of red macular scaly skin on the trunk           Assessment & Plan:  Switch to Diprolene AF cream bid and add a Medrol does pack

## 2014-01-19 ENCOUNTER — Other Ambulatory Visit: Payer: Self-pay | Admitting: Family Medicine

## 2014-04-18 ENCOUNTER — Encounter: Payer: Self-pay | Admitting: Family Medicine

## 2014-04-18 ENCOUNTER — Ambulatory Visit (INDEPENDENT_AMBULATORY_CARE_PROVIDER_SITE_OTHER): Payer: Medicare Other | Admitting: Family Medicine

## 2014-04-18 VITALS — BP 146/88 | HR 74 | Temp 98.0°F | Ht 72.0 in | Wt 185.0 lb

## 2014-04-18 DIAGNOSIS — S46012A Strain of muscle(s) and tendon(s) of the rotator cuff of left shoulder, initial encounter: Secondary | ICD-10-CM

## 2014-04-18 MED ORDER — HYDROCODONE-ACETAMINOPHEN 10-325 MG PO TABS: 1.0000 | ORAL_TABLET | Freq: Three times a day (TID) | ORAL | Status: DC | PRN

## 2014-04-18 NOTE — Progress Notes (Signed)
   Subjective:    Patient ID: Roy Obrien, male    DOB: 07/26/1935, 78 y.o.   MRN: [id number]  HPI Here for 6 days of stiffness and pain in the left shoulder. This makes it hard to raise his arm above the head. No neck pain. Aleve helps. He has recently moved into a condominium and he had been moving some heavy furniture.    Review of Systems  Constitutional: Negative.   Musculoskeletal: Positive for arthralgias, neck pain and neck stiffness.       Objective:   Physical Exam  Constitutional: He appears well-developed and well-nourished.  Musculoskeletal:  His neck is unremarkable. The left shoulder has no tenderness or crepitus. His ROM is limited and he cannot raise his arm above horizontal          Assessment & Plan:  Stay on Aleve bid and use Norco prn. Rest the arm for the next few weeks and recheck prn

## 2014-04-18 NOTE — Progress Notes (Signed)
Pre visit review using our clinic review tool, if applicable. No additional management support is needed unless otherwise documented below in the visit note. 

## 2014-07-26 ENCOUNTER — Encounter: Payer: Self-pay | Admitting: Cardiology

## 2014-07-26 ENCOUNTER — Encounter: Payer: Self-pay | Admitting: Cardiovascular Disease

## 2014-08-04 ENCOUNTER — Encounter: Payer: Self-pay | Admitting: Family Medicine

## 2014-08-04 ENCOUNTER — Ambulatory Visit (INDEPENDENT_AMBULATORY_CARE_PROVIDER_SITE_OTHER): Payer: Medicare Other | Admitting: Family Medicine

## 2014-08-04 VITALS — BP 151/76 | HR 73 | Temp 98.1°F | Ht 72.0 in | Wt 187.0 lb

## 2014-08-04 DIAGNOSIS — H6121 Impacted cerumen, right ear: Secondary | ICD-10-CM | POA: Diagnosis not present

## 2014-08-04 NOTE — Progress Notes (Signed)
   Subjective:    Patient ID: Roy Obrien, male    DOB: May 18, 1935, 79 y.o.   MRN: [id number]  HPI Here for 3 days of loss of hearing in the right ear. No pain.    Review of Systems  Constitutional: Negative.   HENT: Positive for hearing loss. Negative for congestion, ear discharge, ear pain and sinus pressure.   Eyes: Negative.   Neurological: Negative.        Objective:   Physical Exam  Constitutional: He appears well-developed and well-nourished.  HENT:  Left Ear: External ear normal.  Nose: Nose normal.  Mouth/Throat: Oropharynx is clear and moist.  Right ear canal is blocked with cerumen   Eyes: Conjunctivae are normal.  Lymphadenopathy:    He has no cervical adenopathy.          Assessment & Plan:  The ear canal was irrigated clear with water. On re-exam it was normal. Recheck prn

## 2014-08-04 NOTE — Progress Notes (Signed)
Pre visit review using our clinic review tool, if applicable. No additional management support is needed unless otherwise documented below in the visit note. 

## 2014-11-21 ENCOUNTER — Ambulatory Visit (INDEPENDENT_AMBULATORY_CARE_PROVIDER_SITE_OTHER): Payer: Medicare Other | Admitting: Family Medicine

## 2014-11-21 ENCOUNTER — Encounter: Payer: Self-pay | Admitting: Family Medicine

## 2014-11-21 VITALS — BP 130/69 | HR 70 | Temp 97.7°F | Ht 72.0 in | Wt 189.0 lb

## 2014-11-21 DIAGNOSIS — L509 Urticaria, unspecified: Secondary | ICD-10-CM | POA: Diagnosis not present

## 2014-11-21 MED ORDER — PREDNISONE 10 MG PO TABS: ORAL_TABLET | ORAL | Status: DC

## 2014-11-21 NOTE — Progress Notes (Signed)
Pre visit review using our clinic review tool, if applicable. No additional management support is needed unless otherwise documented below in the visit note. 

## 2014-11-21 NOTE — Progress Notes (Signed)
   Subjective:    Patient ID: Roy Obrien, male    DOB: 09-26-1935, 79 y.o.   MRN: [id number]  HPI Here for itchy rashes over the arms and trunk that started about 2 weeks ago. He is applying Diprolene cream to it, and this helps a little. He has a hx of hives that are secondary to eczema.    Review of Systems  Constitutional: Negative.   Respiratory: Negative.   Cardiovascular: Negative.   Skin: Positive for rash.       Objective:   Physical Exam  Constitutional: He appears well-developed and well-nourished.  Cardiovascular: Normal rate, regular rhythm, normal heart sounds and intact distal pulses.   Pulmonary/Chest: Effort normal and breath sounds normal.  Skin:  Scattered red macules and papules over the upper body          Assessment & Plan:  Urticaria, treat with a steroid taper.

## 2014-12-21 ENCOUNTER — Ambulatory Visit (INDEPENDENT_AMBULATORY_CARE_PROVIDER_SITE_OTHER): Payer: Medicare Other | Admitting: Family Medicine

## 2014-12-21 ENCOUNTER — Encounter: Payer: Self-pay | Admitting: Family Medicine

## 2014-12-21 VITALS — BP 133/75 | HR 67 | Temp 98.1°F | Ht 72.0 in | Wt 187.0 lb

## 2014-12-21 DIAGNOSIS — L509 Urticaria, unspecified: Secondary | ICD-10-CM | POA: Diagnosis not present

## 2014-12-21 NOTE — Progress Notes (Signed)
   Subjective:    Patient ID: RINO HOSEA, male    DOB: 1935-12-17, 79 y.o.   MRN: [id number]  HPI Here for some residual hives on the neck and trunk. He was seen a month ago and he took a taper of prednisone. This helped a great deal but the itchy rashes persist. Using Diprolene cream. He knows to avoid the sun.    Review of Systems  Constitutional: Negative.   Respiratory: Negative.   Cardiovascular: Negative.   Skin: Positive for rash.       Objective:   Physical Exam  Constitutional: He appears well-developed and well-nourished. No distress.  Cardiovascular: Normal rate, regular rhythm, normal heart sounds and intact distal pulses.   Pulmonary/Chest: Effort normal and breath sounds normal.  Skin:  Scattered red papules over the back           Assessment & Plan:  Partially treated hives. We will try Zyrtec 10 mg BID and Zantac 150 mg to take 2 tabs bid. Use Diprolene cream prn

## 2014-12-21 NOTE — Progress Notes (Signed)
Pre visit review using our clinic review tool, if applicable. No additional management support is needed unless otherwise documented below in the visit note. 

## 2015-05-30 ENCOUNTER — Ambulatory Visit (INDEPENDENT_AMBULATORY_CARE_PROVIDER_SITE_OTHER): Payer: Medicare Other | Admitting: Family Medicine

## 2015-05-30 ENCOUNTER — Encounter: Payer: Self-pay | Admitting: Family Medicine

## 2015-05-30 VITALS — BP 136/84 | Temp 98.0°F | Ht 72.0 in | Wt 187.0 lb

## 2015-05-30 DIAGNOSIS — Z125 Encounter for screening for malignant neoplasm of prostate: Secondary | ICD-10-CM

## 2015-05-30 DIAGNOSIS — Z0000 Encounter for general adult medical examination without abnormal findings: Secondary | ICD-10-CM | POA: Diagnosis not present

## 2015-05-30 DIAGNOSIS — I10 Essential (primary) hypertension: Secondary | ICD-10-CM

## 2015-05-30 LAB — CBC WITH DIFFERENTIAL/PLATELET
BASOS PCT: 0.6 % (ref 0.0–3.0)
Basophils Absolute: 0 10*3/uL (ref 0.0–0.1)
EOS PCT: 4.8 % (ref 0.0–5.0)
Eosinophils Absolute: 0.3 10*3/uL (ref 0.0–0.7)
HEMATOCRIT: 49.9 % (ref 39.0–52.0)
HEMOGLOBIN: 16.6 g/dL (ref 13.0–17.0)
LYMPHS PCT: 21.9 % (ref 12.0–46.0)
Lymphs Abs: 1.2 10*3/uL (ref 0.7–4.0)
MCHC: 33.4 g/dL (ref 30.0–36.0)
MCV: 96 fl (ref 78.0–100.0)
MONO ABS: 0.4 10*3/uL (ref 0.1–1.0)
MONOS PCT: 8 % (ref 3.0–12.0)
Neutro Abs: 3.4 10*3/uL (ref 1.4–7.7)
Neutrophils Relative %: 64.7 % (ref 43.0–77.0)
Platelets: 212 10*3/uL (ref 150.0–400.0)
RBC: 5.2 Mil/uL (ref 4.22–5.81)
RDW: 13.2 % (ref 11.5–15.5)
WBC: 5.3 10*3/uL (ref 4.0–10.5)

## 2015-05-30 LAB — PSA: PSA: 1.25 ng/mL (ref 0.10–4.00)

## 2015-05-30 LAB — TSH: TSH: 0.63 u[IU]/mL (ref 0.35–4.50)

## 2015-05-30 LAB — HEPATIC FUNCTION PANEL
ALBUMIN: 4.4 g/dL (ref 3.5–5.2)
ALT: 15 U/L (ref 0–53)
AST: 20 U/L (ref 0–37)
Alkaline Phosphatase: 62 U/L (ref 39–117)
Bilirubin, Direct: 0.2 mg/dL (ref 0.0–0.3)
Total Bilirubin: 1.1 mg/dL (ref 0.2–1.2)
Total Protein: 7 g/dL (ref 6.0–8.3)

## 2015-05-30 LAB — LIPID PANEL
Cholesterol: 217 mg/dL — ABNORMAL HIGH (ref 0–200)
HDL: 61.3 mg/dL (ref >39.00)
LDL Cholesterol: 141 mg/dL — ABNORMAL HIGH (ref 0–99)
NonHDL: 155.8
TRIGLYCERIDES: 76 mg/dL (ref 0.0–149.0)
Total CHOL/HDL Ratio: 4
VLDL: 15.2 mg/dL (ref 0.0–40.0)

## 2015-05-30 LAB — BASIC METABOLIC PANEL
BUN: 22 mg/dL (ref 6–23)
CHLORIDE: 102 meq/L (ref 96–112)
CO2: 30 meq/L (ref 19–32)
Calcium: 9.5 mg/dL (ref 8.4–10.5)
Creatinine, Ser: 1.1 mg/dL (ref 0.40–1.50)
GFR: 68.83 mL/min (ref >60.00)
Glucose, Bld: 96 mg/dL (ref 70–99)
Potassium: 3.9 mEq/L (ref 3.5–5.1)
Sodium: 142 mEq/L (ref 135–145)

## 2015-05-30 MED ORDER — BETAMETHASONE DIPROPIONATE AUG 0.05 % EX CREA: TOPICAL_CREAM | Freq: Two times a day (BID) | CUTANEOUS | Status: DC

## 2015-05-30 NOTE — Progress Notes (Signed)
   Subjective:    Patient ID: Roy Obrien, male    DOB: 1935/11/23, 80 y.o.   MRN: [id number]  HPI 80 yr old male for a cpx. He feels well and has no concerns.    Review of Systems  Constitutional: Negative.   HENT: Negative.   Eyes: Negative.   Respiratory: Negative.   Cardiovascular: Negative.   Gastrointestinal: Negative.   Genitourinary: Negative.   Musculoskeletal: Negative.   Skin: Negative.   Neurological: Negative.   Psychiatric/Behavioral: Negative.        Objective:   Physical Exam  Constitutional: He is oriented to person, place, and time. He appears well-developed and well-nourished. No distress.  HENT:  Head: Normocephalic and atraumatic.  Right Ear: External ear normal.  Left Ear: External ear normal.  Nose: Nose normal.  Mouth/Throat: Oropharynx is clear and moist. No oropharyngeal exudate.  Eyes: Conjunctivae and EOM are normal. Pupils are equal, round, and reactive to light. Right eye exhibits no discharge. Left eye exhibits no discharge. No scleral icterus.  Neck: Neck supple. No JVD present. No tracheal deviation present. No thyromegaly present.  Cardiovascular: Normal rate, regular rhythm, normal heart sounds and intact distal pulses.  Exam reveals no gallop and no friction rub.   No murmur heard. EKG normal with occasional PACs   Pulmonary/Chest: Effort normal and breath sounds normal. No respiratory distress. He has no wheezes. He has no rales. He exhibits no tenderness.  Abdominal: Soft. Bowel sounds are normal. He exhibits no distension and no mass. There is no tenderness. There is no rebound and no guarding.  Genitourinary: Rectum normal, prostate normal and penis normal. Guaiac negative stool. No penile tenderness.  Musculoskeletal: Normal range of motion. He exhibits no edema or tenderness.  Lymphadenopathy:    He has no cervical adenopathy.  Neurological: He is alert and oriented to person, place, and time. He has normal reflexes. No cranial  nerve deficit. He exhibits normal muscle tone. Coordination normal.  Skin: Skin is warm and dry. No rash noted. He is not diaphoretic. No erythema. No pallor.  Psychiatric: He has a normal mood and affect. His behavior is normal. Judgment and thought content normal.          Assessment & Plan:  Well exam. We discussed diet and exercise. Get fasting labs.

## 2015-05-30 NOTE — Progress Notes (Signed)
Pre visit review using our clinic review tool, if applicable. No additional management support is needed unless otherwise documented below in the visit note. 

## 2015-05-31 LAB — POC URINALSYSI DIPSTICK (AUTOMATED)
BILIRUBIN UA: NEGATIVE
GLUCOSE UA: NEGATIVE
KETONES UA: NEGATIVE
Leukocytes, UA: NEGATIVE
NITRITE UA: NEGATIVE
Protein, UA: NEGATIVE
Spec Grav, UA: 1.025
Urobilinogen, UA: 0.2
pH, UA: 5.5

## 2015-07-24 ENCOUNTER — Ambulatory Visit (INDEPENDENT_AMBULATORY_CARE_PROVIDER_SITE_OTHER): Payer: Medicare Other | Admitting: Family Medicine

## 2015-07-24 ENCOUNTER — Encounter: Payer: Self-pay | Admitting: Family Medicine

## 2015-07-24 VITALS — BP 138/81 | HR 68 | Temp 98.2°F | Ht 72.0 in | Wt 185.0 lb

## 2015-07-24 DIAGNOSIS — R413 Other amnesia: Secondary | ICD-10-CM | POA: Insufficient documentation

## 2015-07-24 MED ORDER — DONEPEZIL HCL 5 MG PO TABS: 5.0000 mg | ORAL_TABLET | Freq: Every day | ORAL | Status: DC

## 2015-07-24 NOTE — Progress Notes (Signed)
Pre visit review using our clinic review tool, if applicable. No additional management support is needed unless otherwise documented below in the visit note. 

## 2015-07-24 NOTE — Progress Notes (Signed)
   Subjective:    Patient ID: Roy Obrien, male    DOB: [birth year], 80 y.o.   MRN: [id number]  HPI Here to discuss memory loss. He has noticed a gradual mild slip in his recall of people's names in the past 6 months. This has been mild but it is embarrassing to him socially and his wife has noticed this also.    Review of Systems  Constitutional: Negative.   Respiratory: Negative.   Cardiovascular: Negative.   Neurological: Negative.        Objective:   Physical Exam  Constitutional: He is oriented to person, place, and time. He appears well-developed and well-nourished.  Cardiovascular: Normal rate, regular rhythm, normal heart sounds and intact distal pulses.   Pulmonary/Chest: Effort normal and breath sounds normal.  Neurological: He is alert and oriented to person, place, and time. No cranial nerve deficit. He exhibits normal muscle tone. Coordination normal.          Assessment & Plan:  Memory loss. Try Aricept 5 mg daily. Recheck in one month Laurey Morale, MD

## 2015-07-25 ENCOUNTER — Ambulatory Visit: Payer: Medicare Other | Admitting: Family Medicine

## 2015-09-24 ENCOUNTER — Other Ambulatory Visit: Payer: Self-pay | Admitting: Family Medicine

## 2015-09-24 ENCOUNTER — Encounter: Payer: Self-pay | Admitting: Family Medicine

## 2015-09-24 ENCOUNTER — Ambulatory Visit (INDEPENDENT_AMBULATORY_CARE_PROVIDER_SITE_OTHER): Payer: Medicare Other | Admitting: Family Medicine

## 2015-09-24 VITALS — BP 139/76 | HR 68 | Temp 97.6°F | Ht 72.0 in | Wt 180.0 lb

## 2015-09-24 DIAGNOSIS — R413 Other amnesia: Secondary | ICD-10-CM

## 2015-09-24 DIAGNOSIS — K5900 Constipation, unspecified: Secondary | ICD-10-CM

## 2015-09-24 DIAGNOSIS — L309 Dermatitis, unspecified: Secondary | ICD-10-CM

## 2015-09-24 MED ORDER — DONEPEZIL HCL 10 MG PO TABS: 10.0000 mg | ORAL_TABLET | Freq: Every day | ORAL | Status: DC

## 2015-09-24 NOTE — Telephone Encounter (Signed)
Pt would like a refill on something similar to diprolene. Pt ins will not cover diprolene cream  [facility]

## 2015-09-24 NOTE — Progress Notes (Signed)
Pre visit review using our clinic review tool, if applicable. No additional management support is needed unless otherwise documented below in the visit note. 

## 2015-09-24 NOTE — Progress Notes (Signed)
   Subjective:    Patient ID: Roy Obrien, male    DOB: 1935-09-04, 80 y.o.   MRN: [id number]  HPI Here to discuss 3 problems. First he had some mild improvements in memory with Aricept 5 mg daily but he would like to increase the dose. No side effects to report. Second his eczema has been a little worse lately. The Diprolene cream helps a lot but he still has frequent itching. Third he has been a little constipated for several weeks. He gets  fiber in the diet and he drinks water.    Review of Systems  Constitutional: Negative.   Respiratory: Negative.   Cardiovascular: Negative.   Gastrointestinal: Positive for constipation. Negative for nausea, vomiting, abdominal pain, diarrhea, blood in stool, abdominal distention, anal bleeding and rectal pain.  Skin: Positive for rash.  Neurological: Negative.        Objective:   Physical Exam  Constitutional: He is oriented to person, place, and time. He appears well-developed and well-nourished.  Cardiovascular: Normal rate, regular rhythm, normal heart sounds and intact distal pulses.   Pulmonary/Chest: Effort normal and breath sounds normal.  Neurological: He is alert and oriented to person, place, and time.  Skin: Rash noted.  Psychiatric: He has a normal mood and affect. His behavior is normal. Thought content normal.          Assessment & Plan:  For the eczema, he will add Zyrtec 10 mg daily. For the memory loss, increase Aricept to 10 mg daily. For the constipation, try Miralax daily.  Laurey Morale, MD

## 2015-09-26 ENCOUNTER — Other Ambulatory Visit: Payer: Self-pay

## 2015-09-26 MED ORDER — CLOBETASOL PROPIONATE 0.05 % EX CREA: 1.0000 "application " | TOPICAL_CREAM | Freq: Two times a day (BID) | CUTANEOUS | Status: DC | PRN

## 2015-09-26 NOTE — Telephone Encounter (Signed)
Stop the Diprolene and call in Clobetasol 0.05% cream to apply bid prn, 30 grams with 5 rf

## 2015-09-26 NOTE — Telephone Encounter (Signed)
PT WAS NOTIFIED. RX SENT TO PHARMACY.

## 2015-11-29 ENCOUNTER — Encounter: Payer: Self-pay | Admitting: Family Medicine

## 2015-11-29 ENCOUNTER — Ambulatory Visit (INDEPENDENT_AMBULATORY_CARE_PROVIDER_SITE_OTHER): Payer: Medicare Other | Admitting: Family Medicine

## 2015-11-29 ENCOUNTER — Encounter: Payer: Self-pay | Admitting: Gastroenterology

## 2015-11-29 VITALS — BP 137/75 | HR 64 | Temp 98.5°F | Ht 72.0 in | Wt 182.0 lb

## 2015-11-29 DIAGNOSIS — R159 Full incontinence of feces: Secondary | ICD-10-CM | POA: Diagnosis not present

## 2015-11-29 NOTE — Progress Notes (Signed)
Pre visit review using our clinic review tool, if applicable. No additional management support is needed unless otherwise documented below in the visit note. 

## 2015-11-29 NOTE — Progress Notes (Signed)
   Subjective:    Patient ID: Roy Obrien, male    DOB: 1935/05/30, 80 y.o.   MRN: [id number]  HPI Here with his wife to discuss intermttent incontinence of stools. This started about one month ago and he has had 5 episodes so far. This appears to be mucus since it has a clear color and little odor. When he does have a BM they seem to be regular, easy to pass, well formed, etc. No abdominal pains. He has resorted to wearing Depends pull ups since he cannot feel this leakage and has no idea it is happening until someone sees a stain on his pants. His last colonoscopy with Dr. Fuller Plan was in 2007, and even though a follow up was scheduled several times after that it looks like it was never performed.    Review of Systems  Constitutional: Negative.   Respiratory: Negative.   Cardiovascular: Negative.   Gastrointestinal: Negative for abdominal distention, abdominal pain, anal bleeding, blood in stool, constipation, diarrhea, nausea, rectal pain and vomiting.  Genitourinary: Negative.        Objective:   Physical Exam  Constitutional: He appears well-developed and well-nourished.  Cardiovascular: Normal rate, regular rhythm, normal heart sounds and intact distal pulses.   Pulmonary/Chest: Effort normal and breath sounds normal.  Abdominal: Soft. Bowel sounds are normal. He exhibits no distension and no mass. There is no tenderness. There is no rebound and no guarding.  Genitourinary: Rectum normal and prostate normal.  Genitourinary Comments: Good sphincter tone           Assessment & Plan:  Stool incontinence. We will have him see Dr. Fuller Plan again soon to evaluate.  Laurey Morale, MD

## 2016-02-11 ENCOUNTER — Encounter (INDEPENDENT_AMBULATORY_CARE_PROVIDER_SITE_OTHER): Payer: Self-pay

## 2016-02-11 ENCOUNTER — Encounter: Payer: Self-pay | Admitting: Gastroenterology

## 2016-02-11 ENCOUNTER — Ambulatory Visit (INDEPENDENT_AMBULATORY_CARE_PROVIDER_SITE_OTHER): Payer: Medicare Other | Admitting: Gastroenterology

## 2016-02-11 VITALS — BP 124/70 | HR 80 | Ht 71.25 in | Wt 186.0 lb

## 2016-02-11 DIAGNOSIS — Z86010 Personal history of colonic polyps: Secondary | ICD-10-CM

## 2016-02-11 DIAGNOSIS — R159 Full incontinence of feces: Secondary | ICD-10-CM

## 2016-02-11 DIAGNOSIS — K5900 Constipation, unspecified: Secondary | ICD-10-CM

## 2016-02-11 DIAGNOSIS — Z860101 Personal history of adenomatous and serrated colon polyps: Secondary | ICD-10-CM

## 2016-02-11 DIAGNOSIS — K921 Melena: Secondary | ICD-10-CM

## 2016-02-11 MED ORDER — NA SULFATE-K SULFATE-MG SULF 17.5-3.13-1.6 GM/177ML PO SOLN: 1.0000 | Freq: Once | ORAL | 0 refills | Status: AC

## 2016-02-11 NOTE — Progress Notes (Signed)
    History of Present Illness: This is a 80 year old male referred by Laurey Morale, MD for the evaluation of episodic incontinence, hematochezia. He is accompanied by his wife. It was not possible to obtain a detailed history from him due to memory deficits. His wife provides some additional information however the history was limited. From what I can determine he began to have unexplained episodes of fecal incontinence starting this summer. At least one of these episodes was watery with a feculent smell as described by his wife. He has a bowel movement about every 3-4 days that is associated with straining and occasionally with small amounts of hematochezia. He states his bowel movements are typically multiple small stools. Colonoscopy 02/2006 showed 1 small tubular adenoma, diverticulosis, internal hemorrhoids. He did not return for a recommended 5 year interval colonoscopy. Denies weight loss, abdominal pain, diarrhea, change in stool caliber, melena, nausea, vomiting, dysphagia, reflux symptoms, chest pain.  Review of Systems: Pertinent positive and negative review of systems were noted in the above HPI section. All other review of systems were otherwise negative.  Current Medications, Allergies, Past Medical History, Past Surgical History, Family History and Social History were reviewed in [employer] record.  Physical Exam: General: Well developed, well nourished, no acute distress Head: Normocephalic and atraumatic Eyes:  sclerae anicteric, EOMI Ears: Normal auditory acuity Mouth: No deformity or lesions Neck: Supple, no masses or thyromegaly Lungs: Clear throughout to auscultation Heart: Regular rate and rhythm; no murmurs, rubs or bruits Abdomen: Soft, non tender and non distended. No masses, hepatosplenomegaly or hernias noted. Normal Bowel sounds Rectal: Deferred to colonoscopy; recent DRE by Dr. Sarajane Jews showed no lesions and normal sphincter tone Musculoskeletal:  Symmetrical with no gross deformities  Skin: No lesions on visible extremities Pulses:  Normal pulses noted Extremities: No clubbing, cyanosis, edema or deformities noted Neurological: Alert oriented x 4, significant memory deficits Cervical Nodes:  No significant cervical adenopathy Inguinal Nodes: No significant inguinal adenopathy Psychological:  Alert and cooperative. Normal mood and affect  Assessment and Recommendations:  1. Fecal incontinence, constipation, hematochezia, personal history of adenomatous colon polyps. Suspected hemorrhoidal bleeding however need to exclude colorectal neoplasms. A neurologic disorder could be a cause of his intermittent incontinence. Increase daily dietary fiber and daily water intake. Consider a trial of Miralax daily for possible overflow incontinence. Schedule colonoscopy. The risks (including bleeding, perforation, infection, missed lesions, medication reactions and possible hospitalization or surgery if complications occur), benefits, and alternatives to colonoscopy with possible biopsy and possible polypectomy were discussed with the patient and they consent to proceed.    cc: Laurey Morale, MD [address] [place], [place] [address]

## 2016-02-11 NOTE — Patient Instructions (Signed)
You have been scheduled for a colonoscopy. Please follow written instructions given to you at your visit today.  Please pick up your prep supplies at the pharmacy within the next 1-3 days. If you use inhalers (even only as needed), please bring them with you on the day of your procedure. Your physician has requested that you go to www.startemmi.com and enter the access code given to you at your visit today. This web site gives a general overview about your procedure. However, you should still follow specific instructions given to you by our office regarding your preparation for the procedure.  Thank you for choosing me and [facility].  Malcolm T. Stark, Jr., MD., FACG  

## 2016-02-13 ENCOUNTER — Telehealth: Payer: Self-pay | Admitting: Gastroenterology

## 2016-02-13 ENCOUNTER — Encounter: Payer: Self-pay | Admitting: Family Medicine

## 2016-02-13 ENCOUNTER — Ambulatory Visit (INDEPENDENT_AMBULATORY_CARE_PROVIDER_SITE_OTHER): Payer: Medicare Other | Admitting: Family Medicine

## 2016-02-13 VITALS — BP 140/80 | Temp 98.2°F | Ht 71.25 in | Wt 187.0 lb

## 2016-02-13 DIAGNOSIS — M791 Myalgia, unspecified site: Secondary | ICD-10-CM

## 2016-02-13 DIAGNOSIS — R413 Other amnesia: Secondary | ICD-10-CM | POA: Diagnosis not present

## 2016-02-13 LAB — CBC WITH DIFFERENTIAL/PLATELET
BASOS ABS: 0 10*3/uL (ref 0.0–0.1)
BASOS PCT: 0.4 % (ref 0.0–3.0)
Eosinophils Absolute: 0.4 10*3/uL (ref 0.0–0.7)
Eosinophils Relative: 2.8 % (ref 0.0–5.0)
HEMATOCRIT: 45.8 % (ref 39.0–52.0)
Hemoglobin: 15.3 g/dL (ref 13.0–17.0)
LYMPHS ABS: 0.9 10*3/uL (ref 0.7–4.0)
Lymphocytes Relative: 7.3 % — ABNORMAL LOW (ref 12.0–46.0)
MCHC: 33.5 g/dL (ref 30.0–36.0)
MCV: 95.1 fl (ref 78.0–100.0)
MONOS PCT: 6.4 % (ref 3.0–12.0)
Monocytes Absolute: 0.8 10*3/uL (ref 0.1–1.0)
NEUTROS ABS: 10.5 10*3/uL — AB (ref 1.4–7.7)
NEUTROS PCT: 83.1 % — AB (ref 43.0–77.0)
PLATELETS: 358 10*3/uL (ref 150.0–400.0)
RBC: 4.82 Mil/uL (ref 4.22–5.81)
RDW: 12.7 % (ref 11.5–15.5)
WBC: 12.6 10*3/uL — ABNORMAL HIGH (ref 4.0–10.5)

## 2016-02-13 LAB — HEPATIC FUNCTION PANEL
ALK PHOS: 76 U/L (ref 39–117)
ALT: 18 U/L (ref 0–53)
AST: 17 U/L (ref 0–37)
Albumin: 3.9 g/dL (ref 3.5–5.2)
BILIRUBIN DIRECT: 0.2 mg/dL (ref 0.0–0.3)
BILIRUBIN TOTAL: 0.7 mg/dL (ref 0.2–1.2)
TOTAL PROTEIN: 7 g/dL (ref 6.0–8.3)

## 2016-02-13 LAB — BASIC METABOLIC PANEL
BUN: 20 mg/dL (ref 6–23)
CO2: 30 meq/L (ref 19–32)
Calcium: 9.3 mg/dL (ref 8.4–10.5)
Chloride: 101 mEq/L (ref 96–112)
Creatinine, Ser: 0.96 mg/dL (ref 0.40–1.50)
GFR: 80.39 mL/min (ref >60.00)
GLUCOSE: 89 mg/dL (ref 70–99)
POTASSIUM: 4.8 meq/L (ref 3.5–5.1)
SODIUM: 141 meq/L (ref 135–145)

## 2016-02-13 LAB — VITAMIN B12: Vitamin B-12: 367 pg/mL (ref 211–911)

## 2016-02-13 LAB — C-REACTIVE PROTEIN: CRP: 2.8 mg/dL (ref 0.5–20.0)

## 2016-02-13 LAB — TSH: TSH: 0.87 u[IU]/mL (ref 0.35–4.50)

## 2016-02-13 LAB — SEDIMENTATION RATE: Sed Rate: 13 mm/hr (ref 0–20)

## 2016-02-13 LAB — CK: Total CK: 65 U/L (ref 7–232)

## 2016-02-13 MED ORDER — PREDNISONE 10 MG PO TABS: 40.0000 mg | ORAL_TABLET | Freq: Every day | ORAL | 0 refills | Status: DC

## 2016-02-13 NOTE — Progress Notes (Signed)
   Subjective:    Patient ID: Roy Obrien, male    DOB: [birth year], 80 y.o.   MRN: [id number]  HPI Here with his wife to discuss several issues. First his memory continues to slowly worsen and they ask if we can set up an MRI of his brain as we had discussed. Second about 10 days ago he developed a combination of stiffness, pain, and numbness in the right hand. No swelling or color changes. He has a lot of pain if he tries to make a fist or if he squeezes the hand. No pain up the arm. During the past week he has also had intermittent weakness and soreness of some larger muscles, such as the shoulders and hips. He finds it difficult to stand up out of a chair or to get out of bed. Ibuprofen helps for a few hours. No fevers or rashes.    Review of Systems  Constitutional: Positive for fatigue. Negative for chills, diaphoresis and fever.  Respiratory: Negative.   Cardiovascular: Negative.   Gastrointestinal: Negative.   Neurological: Positive for weakness and numbness. Negative for dizziness, light-headedness and headaches.  Psychiatric/Behavioral: Negative.        Objective:   Physical Exam  Constitutional: He is oriented to person, place, and time. He appears well-developed and well-nourished.  Neck: Neck supple. No thyromegaly present.  Cardiovascular: Normal rate, regular rhythm, normal heart sounds and intact distal pulses.   Pulmonary/Chest: Effort normal and breath sounds normal.  Musculoskeletal:  The right hand appears normal but he is very tender over the 3rd and 4th MCP joints and the wrist. He cannot shake my hand without pain. He is very tender over the right lateral epicondyle and mildly tender over the left lateral epicondyle.   Lymphadenopathy:    He has no cervical adenopathy.  Neurological: He is alert and oriented to person, place, and time.  Psychiatric: He has a normal mood and affect. His behavior is normal. Thought content normal.          Assessment &  Plan:  He seems to be having some sort of inflammatory muscle problem, possibly polymyositis or polymyalgia rheumatica. We will start him on Prednisone 40 mg daily. Get labs today including inflammatory markers. As for the memory loss we will set up a brain MRI soon.  Laurey Morale, MD

## 2016-02-13 NOTE — Telephone Encounter (Signed)
Offered patient the coupon that he will pay no more than 50 dollars for the Suprep. Patient states that will be great. Informed patient I will call pharmacy and have them run it. [place] and she wants me to fax the coupon. Faxed coupon to [phone].

## 2016-02-13 NOTE — Progress Notes (Signed)
Pre visit review using our clinic review tool, if applicable. No additional management support is needed unless otherwise documented below in the visit note. 

## 2016-02-14 LAB — RHEUMATOID FACTOR: Rhuematoid fact SerPl-aCnc: <14 IU/mL (ref <14)

## 2016-02-14 LAB — ANA: Anti Nuclear Antibody(ANA): NEGATIVE

## 2016-02-18 ENCOUNTER — Telehealth: Payer: Self-pay

## 2016-02-18 MED ORDER — DIAZEPAM 5 MG PO TABS: ORAL_TABLET | ORAL | 0 refills | Status: DC

## 2016-02-18 NOTE — Telephone Encounter (Signed)
Rx called in to pharmacy. Pt's wife aware. Nothing further needed.

## 2016-02-18 NOTE — Telephone Encounter (Signed)
Pt's wife called [facility] and states that pt is requesting to have "something" prior to his MRI to help with anxiety.   Spoke with pt's wife and she states that she has scheduled pt to have MRI brain for this coming Sunday. Pt is set to be in open MRI, but she thinks that pt still needs something prior to scan so that he is not having anxiety.  Dr. Sarajane Jews - Please advise. Thanks!

## 2016-02-18 NOTE — Telephone Encounter (Signed)
Call in Valium 5 mg to take one hour prior to radiology procedures, #30 with  No rf

## 2016-02-18 NOTE — Telephone Encounter (Signed)
Wife called back and was advised dr Sarajane Jews ok'd to call in rx for pt.

## 2016-02-24 ENCOUNTER — Ambulatory Visit
Admission: RE | Admit: 2016-02-24 | Discharge: 2016-02-24 | Disposition: A | Discharge status: Home / Self Care | Payer: Medicare Other | Source: Ambulatory Visit | Attending: Family Medicine | Admitting: Family Medicine

## 2016-02-24 DIAGNOSIS — R413 Other amnesia: Secondary | ICD-10-CM

## 2016-02-24 MED ORDER — GADOBENATE DIMEGLUMINE 529 MG/ML IV SOLN
17.0000 mL | Freq: Once | INTRAVENOUS | Status: AC | PRN
  Administered 2016-02-24: 17 mL via INTRAVENOUS

## 2016-02-27 ENCOUNTER — Other Ambulatory Visit: Payer: Medicare Other

## 2016-02-28 ENCOUNTER — Telehealth: Payer: Self-pay | Admitting: Family Medicine

## 2016-02-28 NOTE — Telephone Encounter (Signed)
Pts wife would like to have the results from the MRI that was done on Sunday 02/24/16. Wife need clarification from the MRI husband did not understand the call.

## 2016-02-29 NOTE — Telephone Encounter (Signed)
I spoke with pt's wife, she is going to discuss with Dr. Sarajane Jews next week during a office visit.

## 2016-03-18 ENCOUNTER — Encounter: Payer: Medicare Other | Admitting: Gastroenterology

## 2016-03-21 ENCOUNTER — Encounter: Payer: Self-pay | Admitting: Gastroenterology

## 2016-03-24 ENCOUNTER — Encounter: Payer: Self-pay | Admitting: Family Medicine

## 2016-03-24 ENCOUNTER — Ambulatory Visit (INDEPENDENT_AMBULATORY_CARE_PROVIDER_SITE_OTHER): Payer: Medicare Other | Admitting: Family Medicine

## 2016-03-24 VITALS — BP 153/82 | HR 62 | Temp 98.1°F | Ht 71.25 in | Wt 185.0 lb

## 2016-03-24 DIAGNOSIS — I10 Essential (primary) hypertension: Secondary | ICD-10-CM

## 2016-03-24 DIAGNOSIS — M19042 Primary osteoarthritis, left hand: Secondary | ICD-10-CM

## 2016-03-24 DIAGNOSIS — R413 Other amnesia: Secondary | ICD-10-CM

## 2016-03-24 DIAGNOSIS — F411 Generalized anxiety disorder: Secondary | ICD-10-CM | POA: Diagnosis not present

## 2016-03-24 DIAGNOSIS — M19041 Primary osteoarthritis, right hand: Secondary | ICD-10-CM | POA: Diagnosis not present

## 2016-03-24 DIAGNOSIS — R21 Rash and other nonspecific skin eruption: Secondary | ICD-10-CM

## 2016-03-24 MED ORDER — DICLOFENAC SODIUM 75 MG PO TBEC: 75.0000 mg | DELAYED_RELEASE_TABLET | Freq: Two times a day (BID) | ORAL | 5 refills | Status: DC

## 2016-03-24 NOTE — Progress Notes (Signed)
Pre visit review using our clinic review tool, if applicable. No additional management support is needed unless otherwise documented below in the visit note. 

## 2016-03-24 NOTE — Progress Notes (Signed)
   Subjective:    Patient ID: Roy Obrien, male    DOB: Mar 13, 1936, 80 y.o.   MRN: [id number]  HPI Here for several issues. First he is concerned about his memory loss. 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 He is still taking Aricept with questionable results. Also he has had a red facial rash for several months that worries him. He wants to check his BP. Also he complains of stiffness and pain in both hands. This is worse in the mornings. Heat and Advil help for awhile.    Review of Systems  Constitutional: Negative.   Respiratory: Negative.   Cardiovascular: Negative.   Musculoskeletal: Positive for arthralgias. Negative for joint swelling.  Skin: Positive for rash.  Neurological: Negative.        Objective:   Physical Exam  Constitutional: He is oriented to person, place, and time. He appears well-nourished.  Cardiovascular: Normal rate, regular rhythm, normal heart sounds and intact distal pulses.   Pulmonary/Chest: Effort normal and breath sounds normal.  Musculoskeletal: He exhibits no edema.  Both hands show some tenderness around the DIPs and PIPs, no erythema or swelling  Neurological: He is alert and oriented to person, place, and time.  Skin:  The right cheek has a red maculopapular rash           Assessment & Plan:  He has osteoarthritis of the hands, so he will try Diclofenac 75 mg bid instead of Ibuprofen. Refer to Dermatology for the rash. His HTN is stable. Refer to Neurology for memory loss.  Laurey Morale, MD

## 2016-04-03 ENCOUNTER — Ambulatory Visit (AMBULATORY_SURGERY_CENTER): Payer: Medicare Other | Admitting: Gastroenterology

## 2016-04-03 ENCOUNTER — Encounter: Payer: Self-pay | Admitting: Gastroenterology

## 2016-04-03 VITALS — BP 124/66 | HR 68 | Temp 97.8°F | Resp 13 | Ht 71.0 in | Wt 186.0 lb

## 2016-04-03 DIAGNOSIS — K921 Melena: Secondary | ICD-10-CM | POA: Diagnosis present

## 2016-04-03 DIAGNOSIS — D124 Benign neoplasm of descending colon: Secondary | ICD-10-CM

## 2016-04-03 DIAGNOSIS — K5520 Angiodysplasia of colon without hemorrhage: Secondary | ICD-10-CM | POA: Diagnosis not present

## 2016-04-03 DIAGNOSIS — Z86010 Personal history of colonic polyps: Secondary | ICD-10-CM

## 2016-04-03 MED ORDER — SODIUM CHLORIDE 0.9 % IV SOLN: 500.0000 mL | INTRAVENOUS | Status: AC

## 2016-04-03 NOTE — Op Note (Signed)
[facility] Patient Name: Roy Obrien Procedure Date: 04/03/2016 2:21 PM MRN: [id number] Endoscopist: Ladene Artist , MD Age: 80 Referring MD:  Date of Birth: January 24, 1936 Gender: Male Account #: [id number] Procedure:                Colonoscopy Indications:              Hematochezia Medicines:                Monitored Anesthesia Care Procedure:                Pre-Anesthesia Assessment:                           - Prior to the procedure, a History and Physical                            was performed, and patient medications and                            allergies were reviewed. The patient's tolerance of                            previous anesthesia was also reviewed. The risks                            and benefits of the procedure and the sedation                            options and risks were discussed with the patient.                            All questions were answered, and informed consent                            was obtained. Prior Anticoagulants: The patient has                            taken no previous anticoagulant or antiplatelet                            agents. ASA Grade Assessment: III - A patient with                            severe systemic disease. After reviewing the risks                            and benefits, the patient was deemed in                            satisfactory condition to undergo the procedure.                           After obtaining informed consent, the colonoscope  was passed under direct vision. Throughout the                            procedure, the patient's blood pressure, pulse, and                            oxygen saturations were monitored continuously. The                            Model PCF-H190L [id number]) scope was introduced                            through the anus and advanced to the the cecum,                            identified by appendiceal orifice and  ileocecal                            valve. The ileocecal valve, appendiceal orifice,                            and rectum were photographed. The quality of the                            bowel preparation was good. The colonoscopy was                            performed without difficulty. The patient tolerated                            the procedure well. Scope In: 2:29:36 PM Scope Out: 2:43:57 PM Scope Withdrawal Time: 0 hours 11 minutes 52 seconds  Total Procedure Duration: 0 hours 14 minutes 21 seconds  Findings:                 The perianal and digital rectal examinations were                            normal.                           A 9 mm polyp was found in the descending colon. The                            polyp was semi-pedunculated. The polyp was removed                            with a hot snare. Resection and retrieval were                            complete.                           Internal hemorrhoids were found during  retroflexion. The hemorrhoids were medium-sized and                            Grade I (internal hemorrhoids that do not prolapse).                           The exam was otherwise without abnormality on                            direct and retroflexion views.                           A single medium-sized localized angiodysplastic                            lesion without bleeding was found in the ascending                            colon. Complications:            No immediate complications. Estimated blood loss:                            None. Estimated Blood Loss:     Estimated blood loss: none. Impression:               - One 9 mm polyp in the descending colon, removed                            with a hot snare. Resected and retrieved.                           - Internal hemorrhoids.                           - The examination was otherwise normal on direct                            and retroflexion  views. Recommendation:           - Patient has a contact number available for                            emergencies. The signs and symptoms of potential                            delayed complications were discussed with the                            patient. Return to normal activities tomorrow.                            Written discharge instructions were provided to the                            patient.                           -  Resume previous diet.                           - Continue present medications.                           - Prep H supp bid prn                           - Miralax 1-2 times daily prn                           - Await pathology results.                           - No aspirin, ibuprofen, naproxen, or other                            non-steroidal anti-inflammatory drugs for 2 weeks                            after polyp removal.                           - No repeat colonoscopy due to age. Ladene Artist, MD 04/03/2016 2:48:52 PM This report has been signed electronically.

## 2016-04-03 NOTE — Progress Notes (Signed)
Called to room to assist during endoscopic procedure.  Patient ID and intended procedure confirmed with present staff. Received instructions for my participation in the procedure from the performing physician.  

## 2016-04-03 NOTE — Patient Instructions (Signed)
YOU HAD AN ENDOSCOPIC PROCEDURE TODAY AT [facility]:   Refer to the procedure report that was given to you for any specific questions about what was found during the examination.  If the procedure report does not answer your questions, please call your gastroenterologist to clarify.  If you requested that your care partner not be given the details of your procedure findings, then the procedure report has been included in a sealed envelope for you to review at your convenience later.  YOU SHOULD EXPECT: Some feelings of bloating in the abdomen. Passage of more gas than usual.  Walking can help get rid of the air that was put into your GI tract during the procedure and reduce the bloating. If you had a lower endoscopy (such as a colonoscopy or flexible sigmoidoscopy) you may notice spotting of blood in your stool or on the toilet paper. If you underwent a bowel prep for your procedure, you may not have a normal bowel movement for a few days.  Please Note:  You might notice some irritation and congestion in your nose or some drainage.  This is from the oxygen used during your procedure.  There is no need for concern and it should clear up in a day or so.  SYMPTOMS TO REPORT IMMEDIATELY:   Following lower endoscopy (colonoscopy or flexible sigmoidoscopy):  Excessive amounts of blood in the stool  Significant tenderness or worsening of abdominal pains  Swelling of the abdomen that is new, acute  Fever of 100F or higher  For urgent or emergent issues, a gastroenterologist can be reached at any hour by calling [phone].   DIET:  We do recommend a small meal at first, but then you may proceed to your regular diet.  Drink plenty of fluids but you should avoid alcoholic beverages for 24 hours.  ACTIVITY:  You should plan to take it easy for the rest of today and you should NOT DRIVE or use heavy machinery until tomorrow (because of the sedation medicines used during the test).     FOLLOW UP: Our staff will call the number listed on your records the next business day following your procedure to check on you and address any questions or concerns that you may have regarding the information given to you following your procedure. If we do not reach you, we will leave a message.  However, if you are feeling well and you are not experiencing any problems, there is no need to return our call.  We will assume that you have returned to your regular daily activities without incident.  If any biopsies were taken you will be contacted by phone or by letter within the next 1-3 weeks.  Please call us at [phone] if you have not heard about the biopsies in 3 weeks.    SIGNATURES/CONFIDENTIALITY: You and/or your care partner have signed paperwork which will be entered into your electronic medical record.  These signatures attest to the fact that that the information above on your After Visit Summary has been reviewed and is understood.  Full responsibility of the confidentiality of this discharge information lies with you and/or your care-partner.  Polyps (handout given) Hemorrhoids (handout given) Prep H suppository  twice a day as needed Miralax 1-2 times daily as needed Await biopsy results No aspirin, ibuprofen, naproxen, or other non-steroidal anti-inflammatory drugs for 2 weeks after polyps removal

## 2016-04-03 NOTE — Progress Notes (Signed)
A/ox3 pleased with MAC, report to Michelle RN 

## 2016-04-04 ENCOUNTER — Telehealth: Payer: Self-pay

## 2016-04-04 NOTE — Telephone Encounter (Signed)
  Follow up Call-  Call back number 04/03/2016  Post procedure Call Back phone  # [phone]  Permission to leave phone message Yes  Some recent data might be hidden     Patient questions:  Do you have a fever, pain , or abdominal swelling? No. Pain Score  0 *  Have you tolerated food without any problems? Yes.    Have you been able to return to your normal activities? Yes.    Do you have any questions about your discharge instructions: Diet   No. Medications  No. Follow up visit  No.  Do you have questions or concerns about your Care? No.  Actions: * If pain score is 4 or above: No action needed, pain <4.

## 2016-04-10 ENCOUNTER — Encounter: Payer: Self-pay | Admitting: Gastroenterology

## 2016-04-22 ENCOUNTER — Ambulatory Visit (INDEPENDENT_AMBULATORY_CARE_PROVIDER_SITE_OTHER): Payer: Medicare Other | Admitting: Family Medicine

## 2016-04-22 ENCOUNTER — Encounter: Payer: Self-pay | Admitting: Family Medicine

## 2016-04-22 VITALS — BP 150/81 | HR 68 | Temp 97.4°F | Ht 71.0 in | Wt 184.0 lb

## 2016-04-22 DIAGNOSIS — M19041 Primary osteoarthritis, right hand: Secondary | ICD-10-CM | POA: Diagnosis not present

## 2016-04-22 DIAGNOSIS — M19042 Primary osteoarthritis, left hand: Secondary | ICD-10-CM | POA: Diagnosis not present

## 2016-04-22 MED ORDER — MELOXICAM 15 MG PO TABS: 15.0000 mg | ORAL_TABLET | Freq: Every day | ORAL | 5 refills | Status: DC

## 2016-04-22 NOTE — Progress Notes (Signed)
Pre visit review using our clinic review tool, if applicable. No additional management support is needed unless otherwise documented below in the visit note. 

## 2016-04-22 NOTE — Progress Notes (Signed)
   Subjective:    Patient ID: KEENEN HICKLING, male    DOB: 1935-12-14, 81 y.o.   MRN: [id number]  HPI Here for several weeks of stiffness and pain in the right hand and especially the right 3rd finger. He is taking Diclofenac and applying heat with mixed results.    Review of Systems  Constitutional: Negative.   Musculoskeletal: Positive for arthralgias and joint swelling.       Objective:   Physical Exam  Constitutional: He is oriented to person, place, and time. He appears well-developed and well-nourished.  Cardiovascular: Normal rate, regular rhythm, normal heart sounds and intact distal pulses.   Pulmonary/Chest: Effort normal and breath sounds normal.  Musculoskeletal:  He has mild swelling in the PIP and DIP joints of the right hand, especially in the DIP of the right 3rd finger. This is tender and has some crepitus present. The MCP joints and wrist are benign.   Neurological: He is alert and oriented to person, place, and time.          Assessment & Plan:  Osteoarthritis, we will stop the Diclofenac and try Meloxicam 15 mg daily instead. Add Tylenol prn.  Alysia Penna, MD

## 2016-05-14 ENCOUNTER — Telehealth: Payer: Self-pay | Admitting: Family Medicine

## 2016-05-14 DIAGNOSIS — G5690 Unspecified mononeuropathy of unspecified upper limb: Secondary | ICD-10-CM

## 2016-05-14 NOTE — Telephone Encounter (Signed)
I did a referral to Neurology for this

## 2016-05-14 NOTE — Telephone Encounter (Signed)
Pt states he now has tingling and numbness in both hands.  Pt was to let Dr Sarajane Jews know if not better. Pt is not. Pt would like to know what the next step will be. Wife called to assist

## 2016-05-16 NOTE — Telephone Encounter (Signed)
I spoke with pt and he is scheduled for February.

## 2016-05-28 ENCOUNTER — Ambulatory Visit: Payer: Medicare Other | Admitting: Neurology

## 2016-06-13 ENCOUNTER — Encounter: Payer: Self-pay | Admitting: Neurology

## 2016-06-13 ENCOUNTER — Ambulatory Visit (INDEPENDENT_AMBULATORY_CARE_PROVIDER_SITE_OTHER): Payer: Medicare Other | Admitting: Neurology

## 2016-06-13 VITALS — BP 150/86 | HR 71 | Ht 72.0 in | Wt 181.6 lb

## 2016-06-13 DIAGNOSIS — G301 Alzheimer's disease with late onset: Secondary | ICD-10-CM

## 2016-06-13 DIAGNOSIS — F0280 Dementia in other diseases classified elsewhere without behavioral disturbance: Secondary | ICD-10-CM | POA: Diagnosis not present

## 2016-06-13 DIAGNOSIS — F419 Anxiety disorder, unspecified: Secondary | ICD-10-CM | POA: Diagnosis not present

## 2016-06-13 MED ORDER — MEMANTINE HCL 10 MG PO TABS: ORAL_TABLET | ORAL | 0 refills | Status: DC

## 2016-06-13 NOTE — Patient Instructions (Signed)
1.  Start Namenda (memantine) 10mg  tablets.  Take 1/2 tablet at bedtime for 7 days, then 1/2 tablet twice daily for 7 days, then 1/2 tablet in morning and 1 tablet at bedtime for 7 days, then 1 tablet twice daily.   Side effects include dizziness, headache, diarrhea or constipation.  Call with any questions or concerns. 2.  You will continue Aricept (donepezil) 10mg  at bedtime. 3.  Recommend learning new things, such as reading on new topics or watching documentaries. 4.  Remain social.  Have lunch once a week with friends.  Volunteer at [employer] 5.  Try to resume walks or going to the gym 6.  I do not want you to drive unless your wife is in the car with you. 7.  Once we know you are tolerating the Namenda, we can start an antidepressant for anxiety or a medication for nerve pain (if the pain in the arm acts up again). 8.  Follow up in 6 months.   RESOURCES: Development worker, community of [facility]: [phone]  Tel [place]:  [phone]  www.[facility].org/resources.cfm   Resources for common questions found under "Pathways & Protocols "  www.[facility].org/pathways/Pathways_Menu.htm   For assistance with senior care, elder law, and estate planning (POA, medical directives):  [employer]  [address], [place] [address]  Tel: [phone]  [email or web address]   Berneice Heinrich  Tel: [phone]  [email or web address]    Alzheimer Disease Caregiver Guide Alzheimer disease is an illness that affects a person's brain. It causes a person to lose the ability to remember things and make good decisions. As the disease progresses, the person is unable to take care of himself or herself and needs more and more help to do simple tasks. Taking care of someone with Alzheimer disease can be very challenging and overwhelming. Memory loss and confusion Memory loss and confusion is mild in the beginning stages of the disease.  Both of these problems become more severe as the disease progresses. Eventually, the person will not recognize places or even close family members and friends.  Stay calm.  Respond with a short explanation. Long explanations can be overwhelming and confusing.  Avoid corrections that sound like scolding.  Try not to take it personally, even if the person forgets your name. Behavior changes Behavior changes are part of the disease. The person may develop depression, anxiety, anger, hallucinations, or other behavior changes. These changes can come on suddenly and may be in response to pain, infection, changes in the environment (temperature, noise), overstimulation, or feeling lost or scared.  Try not to take behavior changes personally.  Remain calm and patient.  Do not argue or try to convince the person about a specific point. This will only make him or her more agitated.  Know that the behavior changes are part of the disease process and try to work through it. Tips to reduce frustration  Schedule wisely by making appointments and doing daily tasks, like bathing and dressing, when the person is at his or her best.  Take your time. Simple tasks may take a lot longer, so be sure to allow for plenty of time.  Limit choices. Too many choices can be overwhelming and stressful for the person.  Involve the person in what you are doing.  Stick to a routine.  Avoid new or crowded situations, if possible.  Use simple words, short sentences, and a calm voice. Only give one direction at a time.  Buy clothes and shoes that are easy to put on and take off.  Let people help if they offer. Home safety Keeping the home safe is very important to reduce the risk of falls and injuries.  Keep floors clear of clutter. Remove rugs, magazine racks, and floor lamps.  Keep hallways well lit.  Put a handrail and nonslip mat in the bathtub or shower.  Put childproof locks on cabinets with  dangerous items, such as medicine, alcohol, guns, toxic cleaning items, sharp tools or utensils, matches, or lighters.  Place locks on doors where the person cannot easily see or reach them. This helps ensure that the person cannot wander out of the house and get lost.  Be prepared for emergencies. Keep a list of emergency phone numbers and addresses in a convenient area. Plans for the future  Do not put off talking about finances.  Talk about money management. People with Alzheimer disease have trouble managing their money as the disease gets worse.  Get help from professional advisors regarding financial and legal matters.  Do not put off talking about future care.  Choose a power of attorney. This is someone who can make decisions for the person with Alzheimer disease when he or she is no longer able to do so.  Talk about driving and when it is the right time to stop. The person's health care provider can help give advice on this matter.  Talk about the person's living situation. If he or she lives alone, you need to make sure he or she is safe. Some people need extra help at home, and others need more care at a nursing home or care center. Support groups Joining a support group can be very helpful for caregivers of people with Alzheimer disease. Some advantages to being part of a support group include:  Getting strategies to manage stress.  Sharing experiences with others.  Receiving emotional comfort and support.  Learning new caregiving skills as the disease progresses.  Knowing what community resources are available and taking advantage of them. Contact a health care provider if:  The person has a fever.  The person has a sudden change in behavior that does not improve with calming strategies.  The person is unable to manage in his or her current living situation.  The person threatens you or anyone else, including himself or herself.  You are no longer able to care  for the person. This information is not intended to replace advice given to you by your health care provider. Make sure you discuss any questions you have with your health care provider. Document Released: 12/18/2003 Document Revised: 09/19/2015 Document Reviewed: 05/14/2011 Elsevier Interactive Patient Education  2017 [employer].

## 2016-06-13 NOTE — Progress Notes (Signed)
NEUROLOGY CONSULTATION NOTE  Roy Obrien MRN: [id number] DOB: 10-16-1935  Referring provider: Dr. Sarajane Obrien Primary care provider: Dr. Sarajane Obrien  Reason for consult:  Memory deficits  HISTORY OF PRESENT ILLNESS: Roy Obrien is a 81 year old right-handed male with hypertension who presents for memory loss.  He is accompanied by his wife who supplements history.  He began noticing problems with memory in late 2016.  At first, he had trouble remembering appointments and would repeatedly ask questions, even 5 to 10 minutes later.  He also started having trouble remembering names of acquaintances at church and subsequently had trouble recognizing people.  He is extremely anxious and overprotective of his wife.  He never wants her to go out of the house by herself.  When she is away, he develops anxiety and is concerned that something will happen to her.  He is able to perform ADLs, such as bathing, dressing and using the toilet.  His wife typically handled most of the finances.  He would fill out the income tax forms but recently his wife has handled all of those duties.  He still drives.  His wife maintains that he remains cautious behind the wheel but he frequently becomes disoriented on familiar routes and she needs to direct him.  He has not had any accidents or near-accidents.  He doesn't recognize that he has any significant memory problems.    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  He has no known family history of dementia.  Labs from 02/13/16 include ANA negative; Sed Rate 13; CRP 2.8; RF negative; CK 65; TSH 0.87; B12 367; CBC with WBC 12.6, HGB 15.3, HCT 45.8 and PLT 358; BMP with Na 141, K 4.8, Cl 101, CO2 30, glucose 89, BUN 20 and Cr 0.96; hepatic panel with total bili 0.7, ALP 76, AST 17 and ALT 18. MRI of brain with and without contrast from 02/25/16 was personally reviewed and revealed mild cerebral volume loss and chronic  left caudate lacunar infarct, but otherwise unremarkable.  Of note, he has had pain and numbness in the right arm for about 2 months.  He notes symptoms radiating down the lateral arm and into the hand, mostly involving the middle finger.  He had trouble bending the index and middle finger (it felt "tight").  He denied neck pain.  The discomfort is not positional.  There isn't anything that makes it worse or better.  He tried Mobic.  He says it has felt better over the past 3 to 4 days.  PAST MEDICAL HISTORY: Past Medical History:  Diagnosis Date  . ALLERGIC RHINITIS   . Allergy   . Anxiety   . Cataract   . Glaucoma    dr Tobe Sos, [place]  . Hyperlipidemia   . Hypertension   . Tubular adenoma of colon 02/2006  . Vertigo, benign positional     PAST SURGICAL HISTORY: Past Surgical History:  Procedure Laterality Date  . COLONOSCOPY  03-18-06   per Dr. Fuller Plan, repeat in 5 yrs   . foot ablation Right    right foot nerve  . MOHS SURGERY Right 09/03/2006   basal cell carcinoma ear lobe - dr Izora Ribas    MEDICATIONS: Current Outpatient Prescriptions on File Prior to Visit  Medication Sig Dispense Refill  . donepezil (ARICEPT) 10 MG tablet Take 1 tablet (10 mg total) by mouth at bedtime. 90 tablet 3  . meloxicam (MOBIC)  15 MG tablet Take 1 tablet (15 mg total) by mouth daily. 30 tablet 5   Current Facility-Administered Medications on File Prior to Visit  Medication Dose Route Frequency Provider Last Rate Last Dose  . 0.9 %  sodium chloride infusion  500 mL Intravenous Continuous Ladene Artist, MD        ALLERGIES: No Known Allergies  FAMILY HISTORY: Family History  Problem Relation Age of Onset  . Breast cancer Mother   . Coronary artery disease Mother   . Stroke Father   . Emphysema Father     SOCIAL HISTORY: Social History   Social History  . Marital status: Single    Spouse name: married  . Number of children: 3  . Years of education: N/A   Occupational  History  . insurance company     retired   Social History Main Topics  . Smoking status: Never Smoker  . Smokeless tobacco: Never Used  . Alcohol use No  . Drug use: No  . Sexual activity: Not on file   Other Topics Concern  . Not on file   Social History Narrative  . No narrative on file    REVIEW OF SYSTEMS: Constitutional: No fevers, chills, or sweats, no generalized fatigue, change in appetite Eyes: No visual changes, double vision, eye pain Ear, nose and throat: No hearing loss, ear pain, nasal congestion, sore throat Cardiovascular: No chest pain, palpitations Respiratory:  No shortness of breath at rest or with exertion, wheezes GastrointestinaI: No nausea, vomiting, diarrhea, abdominal pain, fecal incontinence Genitourinary:  No dysuria, urinary retention or frequency Musculoskeletal:  No neck pain, back pain Integumentary: No rash, pruritus, skin lesions Neurological: as above Psychiatric: No depression, insomnia, anxiety Endocrine: No palpitations, fatigue, diaphoresis, mood swings, change in appetite, change in weight, increased thirst Hematologic/Lymphatic:  No purpura, petechiae. Allergic/Immunologic: no itchy/runny eyes, nasal congestion, recent allergic reactions, rashes  PHYSICAL EXAM: Vitals:   06/13/16 1006  BP: (!) 150/86  Pulse: 71   General: No acute distress.  Patient appears well-groomed.  Head:  Normocephalic/atraumatic Eyes:  fundi examined but not visualized Neck: supple, no paraspinal tenderness, full range of motion Back: No paraspinal tenderness Heart: regular rate and rhythm Lungs: Clear to auscultation bilaterally. Vascular: No carotid bruits. Neurological Exam: Mental status: alert and oriented to person and place.  In regards to time, he knew the date and day of week, but thought the month was March and the year was 2014.  Recent memory poor, remote memory fair fund of knowledge intact, attention and concentration intact, speech fluent  and not dysarthric, language intact.  He exhibited visuospatial and executive dysfunction.  He incorrectly completed the Trail Making test, copy and cube and draw a clock. MMSE - Mini Mental State Exam 06/13/2016  Orientation to time 1  Orientation to Place 5  Registration 3  Attention/ Calculation 1  Recall 0  Language- name 2 objects 2  Language- repeat 0  Language- follow 3 step command 3  Language- read & follow direction 0  Write a sentence 1  Copy design 0  Total score 16   Cranial nerves: CN I: not tested CN II: pupils equal, round and reactive to light, visual fields intact CN III, IV, VI:  full range of motion, no nystagmus, no ptosis CN V: facial sensation intact CN VII: upper and lower face symmetric CN VIII: hearing intact CN IX, X: gag intact, uvula midline CN XI: sternocleidomastoid and trapezius muscles intact CN XII: tongue midline Bulk &  Tone: normal, no fasciculations. Motor:  5/5 throughout  Sensation: temperature and vibration sensation intact. Deep Tendon Reflexes:  2+ throughout, toes downgoing.  Finger to nose testing:  Without dysmetria.  Heel to shin:  Without dysmetria.  Gait:  Normal station and stride.  Able to turn and tandem walk. Romberg negative.  IMPRESSION: Alzheimer's disease Anxiety Cervical radiculopathy HTN  PLAN: 1.  In addition to Aricept, we will start Namenda, titrating to goal of 10mg  twice daily. 2.  Instructed that he should only drive when his wife accompanies him (and only during daylight hours).   3.  Recommend learning new things, such as reading on new topics or watching documentaries. 4.  Remain social.  Have lunch once a week with friends.  Volunteer at [employer] 5.  Try to resume walks or going to the gym 6.  I do not want you to drive unless your wife is in the car with you. 7.  Once we know he is tolerating the Namenda, we can start an antidepressant for anxiety or a medication for nerve pain (if the pain in the arm  acts up again). 8.  Blood pressure elevated today.  Follow up with PCP. 9.  Follow up in 6 months.   Thank you for allowing me to take part in the care of this patient.  Metta Clines, DO  CC:  Alysia Penna, MD

## 2016-07-28 ENCOUNTER — Telehealth: Payer: Self-pay | Admitting: Neurology

## 2016-07-28 MED ORDER — MEMANTINE HCL 10 MG PO TABS: 10.0000 mg | ORAL_TABLET | Freq: Two times a day (BID) | ORAL | 0 refills | Status: DC

## 2016-07-28 NOTE — Telephone Encounter (Signed)
Received a call from PT's wife regarding medication: Memantine.  Patient needs a refill of medication. Yes  Patient having side effects from medication. No  Patient calling to update [place] on medication. No

## 2016-11-12 ENCOUNTER — Other Ambulatory Visit: Payer: Self-pay | Admitting: Neurology

## 2016-11-13 ENCOUNTER — Other Ambulatory Visit: Payer: Self-pay | Admitting: Neurology

## 2016-11-13 MED ORDER — MEMANTINE HCL 10 MG PO TABS: 10.0000 mg | ORAL_TABLET | Freq: Two times a day (BID) | ORAL | 0 refills | Status: DC

## 2016-11-21 ENCOUNTER — Encounter: Payer: Self-pay | Admitting: Neurology

## 2016-11-21 ENCOUNTER — Ambulatory Visit (INDEPENDENT_AMBULATORY_CARE_PROVIDER_SITE_OTHER): Payer: Medicare Other | Admitting: Neurology

## 2016-11-21 VITALS — BP 138/78 | HR 85 | Ht 72.0 in | Wt 185.1 lb

## 2016-11-21 DIAGNOSIS — F0280 Dementia in other diseases classified elsewhere without behavioral disturbance: Secondary | ICD-10-CM

## 2016-11-21 DIAGNOSIS — F419 Anxiety disorder, unspecified: Secondary | ICD-10-CM

## 2016-11-21 DIAGNOSIS — G301 Alzheimer's disease with late onset: Secondary | ICD-10-CM

## 2016-11-21 MED ORDER — DONEPEZIL HCL 5 MG PO TABS: 5.0000 mg | ORAL_TABLET | Freq: Every day | ORAL | 0 refills | Status: DC

## 2016-11-21 NOTE — Progress Notes (Signed)
NEUROLOGY FOLLOW UP OFFICE NOTE  Roy Obrien [id number]  HISTORY OF PRESENT ILLNESS: Roy Obrien is a 81 year old right-handed male with hypertension who follows up for Alzheimer's dementia.  He is accompanied by his wife who supplements history.  UPDATE: He is taking Namenda 10mg  twice daily, however he has discontinued the Aricept for unknown reason.  He is still driving.  He is still anxious, which is a longstanding issue for at least 50 years.   HISTORY: He began noticing problems with memory in late 2016.  At first, he had trouble remembering appointments and would repeatedly ask questions, even 5 to 10 minutes later.  He also started having trouble remembering names of acquaintances at church and subsequently had trouble recognizing people.  He is extremely anxious and overprotective of his wife.  He never wants her to go out of the house by herself.  When she is away, he develops anxiety and is concerned that something will happen to her.  He is able to perform ADLs, such as bathing, dressing and using the toilet.  His wife typically handled most of the finances.  He would fill out the income tax forms but recently his wife has handled all of those duties.  He still drives.  His wife maintains that he remains cautious behind the wheel but he frequently becomes disoriented on familiar routes and she needs to direct him.  He has not had any accidents or near-accidents.  He doesn't recognize that he has any significant memory problems.     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  He has no known family history of dementia.   Labs from 02/13/16 include ANA negative; Sed Rate 13; CRP 2.8; RF negative; CK 65; TSH 0.87; B12 367; CBC with WBC 12.6, HGB 15.3, HCT 45.8 and PLT 358; BMP with Na 141, K 4.8, Cl 101, CO2 30, glucose 89, BUN 20 and Cr 0.96; hepatic panel with total bili 0.7, ALP 76, AST 17 and ALT 18. MRI of brain  with and without contrast from 02/25/16 was personally reviewed and revealed mild cerebral volume loss and chronic left caudate lacunar infarct, but otherwise unremarkable.   Of note, he has had pain and numbness in the right arm since around December 2017.  He notes symptoms radiating down the lateral arm and into the hand, mostly involving the middle finger.  He had trouble bending the index and middle finger (it felt "tight").  He denied neck pain.  The discomfort is not positional.  There isn't anything that makes it worse or better.  He tried Mobic.    PAST MEDICAL HISTORY: Past Medical History:  Diagnosis Date  . ALLERGIC RHINITIS   . Allergy   . Anxiety   . Cataract   . Glaucoma    dr Tobe Sos,   . Hyperlipidemia   . Hypertension   . Tubular adenoma of colon 02/2006  . Vertigo, benign positional     MEDICATIONS: Current Outpatient Prescriptions on File Prior to Visit  Medication Sig Dispense Refill  . memantine (NAMENDA) 10 MG tablet Take 1 tablet (10 mg total) by mouth 2 (two) times daily. 180 tablet 0   Current Facility-Administered Medications on File Prior to Visit  Medication Dose Route Frequency Provider Last Rate Last Dose  . 0.9 %  sodium chloride infusion  500 mL Intravenous Continuous Ladene Artist, MD  ALLERGIES: No Known Allergies  FAMILY HISTORY: Family History  Problem Relation Age of Onset  . Breast cancer Mother   . Coronary artery disease Mother   . Stroke Father   . Emphysema Father     SOCIAL HISTORY: Social History   Social History  . Marital status: Single    Spouse name: married  . Number of children: 3  . Years of education: N/A   Occupational History  . insurance company     retired   Social History Main Topics  . Smoking status: Never Smoker  . Smokeless tobacco: Never Used  . Alcohol use No  . Drug use: No  . Sexual activity: Not on file   Other Topics Concern  . Not on file   Social History Narrative    . No narrative on file    REVIEW OF SYSTEMS: Constitutional: No fevers, chills, or sweats, no generalized fatigue, change in appetite Eyes: No visual changes, double vision, eye pain Ear, nose and throat: No hearing loss, ear pain, nasal congestion, sore throat Cardiovascular: No chest pain, palpitations Respiratory:  No shortness of breath at rest or with exertion, wheezes GastrointestinaI: No nausea, vomiting, diarrhea, abdominal pain, fecal incontinence Genitourinary:  No dysuria, urinary retention or frequency Musculoskeletal:  No neck pain, back pain Integumentary: No rash, pruritus, skin lesions Neurological: as above Psychiatric: No depression, insomnia, anxiety Endocrine: No palpitations, fatigue, diaphoresis, mood swings, change in appetite, change in weight, increased thirst Hematologic/Lymphatic:  No purpura, petechiae. Allergic/Immunologic: no itchy/runny eyes, nasal congestion, recent allergic reactions, rashes  PHYSICAL EXAM: Vitals:   11/21/16 1129  BP: 138/78  Pulse: 85   General: No acute distress.  Patient appears well-groomed.  normal body habitus. Head:  Normocephalic/atraumatic Eyes:  Fundi examined but not visualized Neck: supple, no paraspinal tenderness, full range of motion Heart:  Regular rate and rhythm Lungs:  Clear to auscultation bilaterally Back: No paraspinal tenderness Neurological Exam: alert and oriented to person and place. Attention span and concentration intact, recent memory poor, remote memory intact, fund of knowledge intact.  Speech fluent and not dysarthric, language intact.  CN II-XII intact. Bulk and tone normal, muscle strength 5/5 throughout.  Sensation to light touch intact.  Deep tendon reflexes 2+ throughout, toes downgoing.  Finger to nose testing intact.  Gait normal, Romberg negative.  IMPRESSION: Alzheimer's disease Anxiety  PLAN: 1.  We will restart Aricept (5mg  at bedtime for one month, then increase to 10mg  at  bedtime). 2.  If still tolerating Aricept in 2 months, will start Paxil for anxiety (he has previously tried Lexapro and Zoloft). 3.  I instructed him that he should not drive.  As he feels differently, I provided him information to schedule a formal driving safety evaluation by an occupational therapist. 4.  Follow up in 6 months.  25 minutes spent face to face with patient and his wife, over 50% spent discussing management and reason why he should not drive until driving safety assessment.Roy Clines, DO  CC:  Alysia Penna, MD

## 2016-11-21 NOTE — Patient Instructions (Signed)
1.  We will restart donepezil (Aricept) 5mg  daily for four weeks.  If you are tolerating the medication, then after four weeks contact us and we will increase the dose to 10mg  daily.  Side effects include nausea, vomiting, diarrhea, vivid dreams, and muscle cramps.  Please call the clinic if you experience any of these symptoms.  2.  Continue memantine 10mg  twice daily. 3.  I do not want you to drive.  If you think you can still drive, then please make an appointment with an occupational therapist who assesses driving. 4.  Follow up in 6 months.

## 2016-12-11 ENCOUNTER — Ambulatory Visit: Payer: Medicare Other | Admitting: Neurology

## 2016-12-30 ENCOUNTER — Telehealth: Payer: Self-pay | Admitting: Neurology

## 2016-12-30 NOTE — Telephone Encounter (Signed)
Pt's wife called and said when pt was finished  His 5mg  aricept he was to call

## 2016-12-31 MED ORDER — DONEPEZIL HCL 10 MG PO TABS: 10.0000 mg | ORAL_TABLET | Freq: Every day | ORAL | 6 refills | Status: DC

## 2016-12-31 NOTE — Telephone Encounter (Signed)
Called and talked with wife, Pt is doing well and she wants to increase Aricept to 10mg  [facility] as discussed. ADvsd will send in Rx, advsd her to let us know how Pt is in 30 days to see about adding Paxil. Verified pharmacy

## 2017-02-26 ENCOUNTER — Other Ambulatory Visit: Payer: Self-pay

## 2017-02-26 MED ORDER — DONEPEZIL HCL 10 MG PO TABS: 10.0000 mg | ORAL_TABLET | Freq: Every day | ORAL | 3 refills | Status: DC

## 2017-02-26 MED ORDER — MEMANTINE HCL 10 MG PO TABS: 10.0000 mg | ORAL_TABLET | Freq: Two times a day (BID) | ORAL | 3 refills | Status: DC

## 2017-05-25 ENCOUNTER — Encounter: Payer: Self-pay | Admitting: Neurology

## 2017-05-25 ENCOUNTER — Ambulatory Visit (INDEPENDENT_AMBULATORY_CARE_PROVIDER_SITE_OTHER): Payer: Medicare Other | Admitting: Neurology

## 2017-05-25 VITALS — BP 128/80 | HR 73 | Ht 72.5 in | Wt 183.6 lb

## 2017-05-25 DIAGNOSIS — F0280 Dementia in other diseases classified elsewhere without behavioral disturbance: Secondary | ICD-10-CM

## 2017-05-25 DIAGNOSIS — G301 Alzheimer's disease with late onset: Secondary | ICD-10-CM | POA: Diagnosis not present

## 2017-05-25 DIAGNOSIS — F411 Generalized anxiety disorder: Secondary | ICD-10-CM | POA: Diagnosis not present

## 2017-05-25 MED ORDER — PAROXETINE HCL 20 MG PO TABS: 20.0000 mg | ORAL_TABLET | Freq: Every day | ORAL | 5 refills | Status: DC

## 2017-05-25 NOTE — Progress Notes (Signed)
NEUROLOGY FOLLOW UP OFFICE NOTE  Roy Obrien [id number]  HISTORY OF PRESENT ILLNESS: Roy Obrien is a 82 year old right-handed male with hypertension who follows up for Alzheimer's dementia.  He is accompanied by his wife who supplements history.   UPDATE: He is taking Aricept 10mg  daily and Namenda 10mg  twice daily.  Anxiety is still a significant issue that puts a burden on his wife.  He still drives.  He dresses, bathes and uses the toilet himself.  His wife manages and administers his medication.  He and his wife recently filled out POA for both health and financial matters.   HISTORY: He began noticing problems with memory in late 2016.  At first, he had trouble remembering appointments and would repeatedly ask questions, even 5 to 10 minutes later.  He also started having trouble remembering names of acquaintances at church and subsequently had trouble recognizing people.  He is extremely anxious and overprotective of his wife.  He never wants her to go out of the house by herself.  When she is away, he develops anxiety and is concerned that something will happen to her.  He is able to perform ADLs, such as bathing, dressing and using the toilet.  His wife typically handled most of the finances.  He would fill out the income tax forms but recently his wife has handled all of those duties.  He still drives.  His wife maintains that he remains cautious behind the wheel but he frequently becomes disoriented on familiar routes and she needs to direct him.  He has not had any accidents or near-accidents.  He doesn't recognize that he has any significant memory problems.     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  He has no known family history of dementia.   MRI of brain with and without contrast from 02/25/16 was personally reviewed and revealed mild cerebral volume loss and chronic left caudate lacunar infarct, but  otherwise unremarkable.  PAST MEDICAL HISTORY: Past Medical History:  Diagnosis Date  . ALLERGIC RHINITIS   . Allergy   . Anxiety   . Cataract   . Glaucoma    dr Tobe Sos, [place]  . Hyperlipidemia   . Hypertension   . Tubular adenoma of colon 02/2006  . Vertigo, benign positional     MEDICATIONS: Current Outpatient Medications on File Prior to Visit  Medication Sig Dispense Refill  . acetaminophen (TYLENOL) 500 MG tablet Take 500 mg by mouth every 6 (six) hours as needed.    . donepezil (ARICEPT) 10 MG tablet Take 1 tablet (10 mg total) by mouth at bedtime. 30 tablet 6  . donepezil (ARICEPT) 10 MG tablet Take 1 tablet (10 mg total) at bedtime by mouth. 30 tablet 3  . donepezil (ARICEPT) 5 MG tablet Take 1 tablet (5 mg total) by mouth at bedtime. 30 tablet 0  . memantine (NAMENDA) 10 MG tablet Take 1 tablet (10 mg total) by mouth 2 (two) times daily. 180 tablet 0  . memantine (NAMENDA) 10 MG tablet Take 1 tablet (10 mg total) 2 (two) times daily by mouth. 60 tablet 3   Current Facility-Administered Medications on File Prior to Visit  Medication Dose Route Frequency Provider Last Rate Last Dose  . 0.9 %  sodium chloride infusion  500 mL Intravenous Continuous Ladene Artist, MD        ALLERGIES: No Known Allergies  FAMILY HISTORY: Family  History  Problem Relation Age of Onset  . Breast cancer Mother   . Coronary artery disease Mother   . Stroke Father   . Emphysema Father     SOCIAL HISTORY: Social History   Socioeconomic History  . Marital status: Single    Spouse name: married  . Number of children: 3  . Years of education: Not on file  . Highest education level: Not on file  Social Needs  . Financial resource strain: Not on file  . Food insecurity - worry: Not on file  . Food insecurity - inability: Not on file  . Transportation needs - medical: Not on file  . Transportation needs - non-medical: Not on file  Occupational History  . Occupation:  IT consultant    Comment: retired  Tobacco Use  . Smoking status: Never Smoker  . Smokeless tobacco: Never Used  Substance and Sexual Activity  . Alcohol use: No    Alcohol/week: 0.0 oz  . Drug use: No  . Sexual activity: Not on file  Other Topics Concern  . Not on file  Social History Narrative  . Not on file    REVIEW OF SYSTEMS: Constitutional: No fevers, chills, or sweats, no generalized fatigue, change in appetite Eyes: No visual changes, double vision, eye pain Ear, nose and throat: No hearing loss, ear pain, nasal congestion, sore throat Cardiovascular: No chest pain, palpitations Respiratory:  No shortness of breath at rest or with exertion, wheezes GastrointestinaI: No nausea, vomiting, diarrhea, abdominal pain, fecal incontinence Genitourinary:  No dysuria, urinary retention or frequency Musculoskeletal:  No neck pain, back pain Integumentary: No rash, pruritus, skin lesions Neurological: as above Psychiatric: No depression, insomnia, anxiety Endocrine: No palpitations, fatigue, diaphoresis, mood swings, change in appetite, change in weight, increased thirst Hematologic/Lymphatic:  No purpura, petechiae. Allergic/Immunologic: no itchy/runny eyes, nasal congestion, recent allergic reactions, rashes  PHYSICAL EXAM: Vitals:   05/25/17 1127  BP: 128/80  Pulse: 73  SpO2: 99%   General: No acute distress.  Patient appears well-groomed.  normal body habitus. Head:  Normocephalic/atraumatic Eyes:  Fundi examined but not visualized Neck: supple, no paraspinal tenderness, full range of motion Heart:  Regular rate and rhythm Lungs:  Clear to auscultation bilaterally Back: No paraspinal tenderness Neurological Exam: alert and oriented to person, place, month and day of week only. Attention span and concentration impaired, recent memory poor, remote memory intact, fund of knowledge intact.  Speech fluent and not dysarthric, language intact.   MMSE - Mini Mental State  Exam 05/25/2017 11/21/2016 06/13/2016  Not completed: - Unable to complete -  Orientation to time 2 - 1  Orientation to Place 3 - 5  Registration 3 - 3  Attention/ Calculation 0 - 1  Recall 0 - 0  Language- name 2 objects 2 - 2  Language- repeat 0 - 0  Language- follow 3 step command 3 - 3  Language- read & follow direction 1 - 0  Write a sentence 0 - 1  Copy design 1 - 0  Total score 15 - 16   CN II-XII intact. Bulk and tone normal, muscle strength 5/5 throughout.  Sensation to light touch intact.  Deep tendon reflexes 2+ throughout, toes downgoing.  Finger to nose testing intact.  Gait normal  IMPRESSION: Alzheimer's disease Anxiety disorder  PLAN: 1.  Continue donepezil 10mg  at bedtime and memantine 10mg  twice daily 2.  Start paroxetine 20mg  daily for anxiety 3.  Had long discussion about driving cessation.   4.  Follow up in 9 months.  27 minutes spent face to face with patient, over 50% spent discussing management.  Metta Clines, DO  CC: Alysia Penna, MD

## 2017-05-25 NOTE — Patient Instructions (Signed)
1.  We will start paroxetine 20mg  daily for anxiety 2.  Continue the donepezil 10mg  at bedtime and memantine 10mg  twice daily for memory 3.  No driving.   4.  Follow up in 9 months.

## 2017-05-31 ENCOUNTER — Other Ambulatory Visit: Payer: Self-pay | Admitting: Neurology

## 2017-06-03 ENCOUNTER — Telehealth: Payer: Self-pay | Admitting: Neurology

## 2017-06-03 NOTE — Telephone Encounter (Signed)
Patient's wife called the after hours nurse line. The nurse called and said that the patient is needing a Prior Auth on the new medication that Dr. Tomi Likens had prescribed. He uses [facility]. Please Call. Thanks

## 2017-06-04 NOTE — Telephone Encounter (Signed)
Initiated PA on cover my meds. OFV:[id number]

## 2017-06-05 NOTE — Telephone Encounter (Addendum)
Rcvd approval for paroxetine 20 mg tab 06/04/17-06/04/18. Attempted to call [facility] to advise them, they do not open until 9am. Called wife, [name], [place] on VM advising of approval and to call pharmacy to fill medication.

## 2017-08-07 DIAGNOSIS — H2513 Age-related nuclear cataract, bilateral: Secondary | ICD-10-CM | POA: Diagnosis not present

## 2017-08-14 ENCOUNTER — Encounter: Payer: Self-pay | Admitting: Neurology

## 2017-09-06 ENCOUNTER — Other Ambulatory Visit: Payer: Self-pay | Admitting: Neurology

## 2017-09-21 ENCOUNTER — Telehealth: Payer: Self-pay | Admitting: Neurology
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
## 2017-09-21 NOTE — Telephone Encounter (Signed)
Called and spoke with Roy Obrien. I advised him I will need Roy Obrien to call, I do not have authorization to speak with him. He will have step-mother Roy Obrien call.

## 2017-09-21 NOTE — Telephone Encounter (Signed)
Roy Obrien, LMOVM for her to call me back.

## 2017-09-21 NOTE — Telephone Encounter (Signed)
Patient's wife called and needs to see if his medication can be increased due to him being short tempered as well as over stressed. She needs something to help calm him down more. Please Call. Thanks

## 2017-09-22 NOTE — Telephone Encounter (Signed)
We can start low-dose Depakote ER 250mg  at bedtime.  We will need to check baseline CBC, hepatic panel and TSH.  I would repeat labs in 3 months.  It may take a little time to take effect.  If they need something more immediate, then they will need to come in for an appointment to discuss alternatives.

## 2017-09-22 NOTE — Telephone Encounter (Signed)
Roy Obrien, LMOVM for her to rtrn my call

## 2017-09-23 NOTE — Telephone Encounter (Signed)
Called and spoke with Roy Obrien. She has just returned from the hospital fom kidney surgery. She is overwhelmed with how the Pt is acting, he is upset about not driving. She will call in a few days, after she has recovered somewhat,  to schedule labs.

## 2017-10-09 ENCOUNTER — Other Ambulatory Visit: Payer: Self-pay

## 2017-10-09 ENCOUNTER — Other Ambulatory Visit: Payer: Medicare Other

## 2017-10-09 DIAGNOSIS — Z79899 Other long term (current) drug therapy: Secondary | ICD-10-CM

## 2017-10-09 LAB — CBC
HCT: 43.5 % (ref 38.5–50.0)
Hemoglobin: 15.2 g/dL (ref 13.2–17.1)
MCH: 32.1 pg (ref 27.0–33.0)
MCHC: 34.9 g/dL (ref 32.0–36.0)
MCV: 92 fL (ref 80.0–100.0)
MPV: 10.7 fL (ref 7.5–12.5)
Platelets: 248 10*3/uL (ref 140–400)
RBC: 4.73 10*6/uL (ref 4.20–5.80)
RDW: 12.8 % (ref 11.0–15.0)
WBC: 5.9 10*3/uL (ref 3.8–10.8)

## 2017-10-09 LAB — TSH: TSH: 1.13 mIU/L (ref 0.40–4.50)

## 2017-10-09 LAB — HEPATIC FUNCTION PANEL
AG Ratio: 1.7 (calc) (ref 1.0–2.5)
ALT: 24 U/L (ref 9–46)
AST: 21 U/L (ref 10–35)
Albumin: 4.1 g/dL (ref 3.6–5.1)
Alkaline phosphatase (APISO): 87 U/L (ref 40–115)
BILIRUBIN INDIRECT: 0.5 mg/dL (ref 0.2–1.2)
Bilirubin, Direct: 0.1 mg/dL (ref 0.0–0.2)
Globulin: 2.4 g/dL (calc) (ref 1.9–3.7)
TOTAL PROTEIN: 6.5 g/dL (ref 6.1–8.1)
Total Bilirubin: 0.6 mg/dL (ref 0.2–1.2)

## 2017-10-14 ENCOUNTER — Telehealth: Payer: Self-pay

## 2017-10-14 MED ORDER — DIVALPROEX SODIUM ER 250 MG PO TB24: 250.0000 mg | ORAL_TABLET | Freq: Every day | ORAL | 0 refills | Status: DC

## 2017-10-14 NOTE — Telephone Encounter (Signed)
Called and spoke with [name] about lab results. She would like to start tthe depakote, but wanted to bring Pt in ASAP. We had a cancellation 7/1, scheduled Pt then

## 2017-10-14 NOTE — Telephone Encounter (Signed)
Called and spoke with [name], advised her of lab results

## 2017-10-14 NOTE — Addendum Note (Signed)
Addended by: Clois Comber on: 10/14/2017 03:58 PM   Modules accepted: Orders

## 2017-10-14 NOTE — Telephone Encounter (Signed)
-----   Message from [place], DO sent at 10/12/2017  7:33 AM EDT ----- I have reviewed all lab results which are normal or stable. Please inform the patient.

## 2017-10-19 ENCOUNTER — Encounter: Payer: Self-pay | Admitting: Neurology

## 2017-10-19 ENCOUNTER — Ambulatory Visit (INDEPENDENT_AMBULATORY_CARE_PROVIDER_SITE_OTHER): Payer: Medicare Other | Admitting: Neurology

## 2017-10-19 VITALS — BP 146/82 | HR 69 | Ht 72.5 in | Wt 182.0 lb

## 2017-10-19 DIAGNOSIS — F411 Generalized anxiety disorder: Secondary | ICD-10-CM

## 2017-10-19 DIAGNOSIS — F0280 Dementia in other diseases classified elsewhere without behavioral disturbance: Secondary | ICD-10-CM

## 2017-10-19 DIAGNOSIS — G301 Alzheimer's disease with late onset: Secondary | ICD-10-CM

## 2017-10-19 MED ORDER — QUETIAPINE FUMARATE 25 MG PO TABS: 25.0000 mg | ORAL_TABLET | Freq: Every day | ORAL | 3 refills | Status: DC

## 2017-10-19 NOTE — Patient Instructions (Signed)
1.  Stop paroxetine.  Instead, start quetiapine 25mg  at bedtime for agitation 2.  Continue depakote ER 250mg  at bedtime (we will repeat CBC and CMP in 3 months), donepezil 10mg  at bedtime and memantine 10mg  twice daily 3.  Please review Alzheimer's resources 4.  Please refrain from driving 5.  Follow up in November as already scheduled.

## 2017-10-19 NOTE — Progress Notes (Signed)
NEUROLOGY FOLLOW UP OFFICE NOTE  Roy Obrien [id number]  HISTORY OF PRESENT ILLNESS: Roy Obrien is a 82 year old right-handed male with hypertension who follows up for Alzheimer's dementia.  He is accompanied by his wife and granddaughter (who is a Marine scientist) who supplement history.   UPDATE: He is taking Aricept 10mg  daily and Namenda 10mg  twice daily.  He was initially started on paroxetine 20mg  for anxiety.  He continued to have significant anxiety and agitation.  He drove to [place].  He was started on Depakote ER 250mg  at bedtime about 10 days ago.  CBC, hepatic panel and TSH were normal.   HISTORY: He began noticing problems with memory in late 2016.  At first, he had trouble remembering appointments and would repeatedly ask questions, even 5 to 10 minutes later.  He also started having trouble remembering names of acquaintances at church and subsequently had trouble recognizing people.  He is extremely anxious and overprotective of his wife.  He never wants her to go out of the house by herself.  When she is away, he develops anxiety and is concerned that something will happen to her.  He is able to perform ADLs, such as bathing, dressing and using the toilet.  His wife typically handled most of the finances.  He would fill out the income tax forms but recently his wife has handled all of those duties.  He still drives.  His wife maintains that he remains cautious behind the wheel but he frequently becomes disoriented on familiar routes and she needs to direct him.  He has not had any accidents or near-accidents.  He doesn't recognize that he has any significant memory problems.     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  He has no known family history of dementia.   MRI of brain with and without contrast from 02/25/16 was personally reviewed and revealed mild cerebral volume loss and chronic left caudate lacunar  infarct, but otherwise unremarkable.  His wife is his POA.  PAST MEDICAL HISTORY: Past Medical History:  Diagnosis Date  . ALLERGIC RHINITIS   . Allergy   . Anxiety   . Cataract   . Glaucoma    dr Tobe Sos, [place]  . Hyperlipidemia   . Hypertension   . Tubular adenoma of colon 02/2006  . Vertigo, benign positional     MEDICATIONS: Current Outpatient Medications on File Prior to Visit  Medication Sig Dispense Refill  . acetaminophen (TYLENOL) 500 MG tablet Take 500 mg by mouth every 6 (six) hours as needed.    . divalproex (DEPAKOTE ER) 250 MG 24 hr tablet Take 1 tablet (250 mg total) by mouth at bedtime. 30 tablet 0  . donepezil (ARICEPT) 10 MG tablet Take 1 tablet (10 mg total) at bedtime by mouth. 30 tablet 3  . donepezil (ARICEPT) 10 MG tablet TAKE ONE TABLET BY MOUTH AT BEDTIME (Patient not taking: Reported on 10/19/2017) 90 tablet 3  . donepezil (ARICEPT) 5 MG tablet Take 1 tablet (5 mg total) by mouth at bedtime. (Patient not taking: Reported on 10/19/2017) 30 tablet 0  . memantine (NAMENDA) 10 MG tablet Take 1 tablet (10 mg total) by mouth 2 (two) times daily. 180 tablet 0  . memantine (NAMENDA) 10 MG tablet TAKE ONE TABLET BY MOUTH TWICE A DAY 180 tablet 2   Current Facility-Administered Medications on File Prior to Visit  Medication Dose Route Frequency  Provider Last Rate Last Dose  . 0.9 %  sodium chloride infusion  500 mL Intravenous Continuous Ladene Artist, MD        ALLERGIES: No Known Allergies  FAMILY HISTORY: Family History  Problem Relation Age of Onset  . Breast cancer Mother   . Coronary artery disease Mother   . Stroke Father   . Emphysema Father     SOCIAL HISTORY: Social History   Socioeconomic History  . Marital status: Single    Spouse name: married  . Number of children: 3  . Years of education: Not on file  . Highest education level: Not on file  Occupational History  . Occupation: IT consultant    Comment: retired  Photographer  . Financial resource strain: Not on file  . Food insecurity:    Worry: Not on file    Inability: Not on file  . Transportation needs:    Medical: Not on file    Non-medical: Not on file  Tobacco Use  . Smoking status: Never Smoker  . Smokeless tobacco: Never Used  Substance and Sexual Activity  . Alcohol use: No    Alcohol/week: 0.0 oz  . Drug use: No  . Sexual activity: Not on file  Lifestyle  . Physical activity:    Days per week: Not on file    Minutes per session: Not on file  . Stress: Not on file  Relationships  . Social connections:    Talks on phone: Not on file    Gets together: Not on file    Attends religious service: Not on file    Active member of club or organization: Not on file    Attends meetings of clubs or organizations: Not on file    Relationship status: Not on file  . Intimate partner violence:    Fear of current or ex partner: Not on file    Emotionally abused: Not on file    Physically abused: Not on file    Forced sexual activity: Not on file  Other Topics Concern  . Not on file  Social History Narrative  . Not on file    REVIEW OF SYSTEMS: Constitutional: No fevers, chills, or sweats, no generalized fatigue, change in appetite Eyes: No visual changes, double vision, eye pain Ear, nose and throat: No hearing loss, ear pain, nasal congestion, sore throat Cardiovascular: No chest pain, palpitations Respiratory:  No shortness of breath at rest or with exertion, wheezes GastrointestinaI: No nausea, vomiting, diarrhea, abdominal pain, fecal incontinence Genitourinary:  No dysuria, urinary retention or frequency Musculoskeletal:  No neck pain, back pain Integumentary: No rash, pruritus, skin lesions Neurological: as above Psychiatric: No depression, insomnia, anxiety Endocrine: No palpitations, fatigue, diaphoresis, mood swings, change in appetite, change in weight, increased thirst Hematologic/Lymphatic:  No purpura,  petechiae. Allergic/Immunologic: no itchy/runny eyes, nasal congestion, recent allergic reactions, rashes  PHYSICAL EXAM: Vitals:   10/19/17 1435  BP: (!) 146/82  Pulse: 69  SpO2: 95%   General: No acute distress.  Patient appears well-groomed.  normal body habitus.  IMPRESSION: Alzhemier's disease  PLAN: 1.  Stop paroxetine.  Instead, start quetiapine 25mg  at bedtime for agitation (discussed black box warning of increased morbidity and mortality with patient and family) 2.  Continue depakote ER 250mg  at bedtime (we will repeat CBC and CMP in 3 months), donepezil 10mg  at bedtime and memantine 10mg  twice daily 3.  Provided Alzheimer's resources and recommended an aide to help at home 4.  Advised  that he should no longer drive. 5.  Follow up in November as already scheduled.  25 minutes spent face to face with patient, 100% spent discussing diagnosis and management.  Metta Clines, DO  CC:  Dr. Sarajane Jews

## 2017-11-02 ENCOUNTER — Other Ambulatory Visit: Payer: Self-pay | Admitting: Neurology

## 2017-12-10 ENCOUNTER — Other Ambulatory Visit: Payer: Self-pay | Admitting: Neurology

## 2017-12-10 ENCOUNTER — Telehealth: Payer: Self-pay | Admitting: Neurology

## 2017-12-10 MED ORDER — DIVALPROEX SODIUM ER 250 MG PO TB24: 250.0000 mg | ORAL_TABLET | Freq: Every day | ORAL | 3 refills | Status: DC

## 2017-12-10 NOTE — Telephone Encounter (Signed)
Patient's wife is calling needing the name of his new medication he was supposed to be taking and also needing a refill on that. Please call her [phone]. Thanks.

## 2017-12-10 NOTE — Telephone Encounter (Signed)
In addition to Depakote, last visit I prescribed Roy Obrien Seroquel 25mg  at bedtime for agitation.  If he has been taking this, he can take 25mg  in the morning as well.
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
## 2017-12-11 NOTE — Telephone Encounter (Signed)
Roy Obrien, LMOVM advising her to try an additional [place] during the day to help with aggitation. I asked she call for refill if effective and we will send in increased dose.

## 2017-12-24 ENCOUNTER — Telehealth: Payer: Self-pay | Admitting: Neurology

## 2017-12-24 NOTE — Telephone Encounter (Signed)
Patient's wife given instructions and agreed with plan.

## 2017-12-24 NOTE — Telephone Encounter (Signed)
Patient's wife called and stated that the patient was very angry and he was hiding stuff. She was trying to not have him catch her on the phone because he would get upset. She wanted to know if he could up his medication anymore. The medication was just increased but she didn't think it was helping enough. She couldn't tell me the name. Please call her back at [phone].Thanks!

## 2017-12-24 NOTE — Telephone Encounter (Signed)
Increase Seroquel from 25mg  twice daily to 50mg  twice daily

## 2017-12-28 ENCOUNTER — Telehealth: Payer: Self-pay | Admitting: Neurology

## 2017-12-28 DIAGNOSIS — F411 Generalized anxiety disorder: Secondary | ICD-10-CM

## 2017-12-28 DIAGNOSIS — F0280 Dementia in other diseases classified elsewhere without behavioral disturbance: Secondary | ICD-10-CM

## 2017-12-28 DIAGNOSIS — G301 Alzheimer's disease with late onset: Principal | ICD-10-CM

## 2017-12-28 NOTE — Telephone Encounter (Signed)
Patient's wife called and left a voicemail stating that she has had a rough past couple weeks with her husband. She wanted to know about increasing his medication. Please call her at [phone]. Thanks!

## 2017-12-29 ENCOUNTER — Telehealth: Payer: Self-pay | Admitting: Neurology

## 2017-12-29 MED ORDER — QUETIAPINE FUMARATE 50 MG PO TABS: 50.0000 mg | ORAL_TABLET | Freq: Every day | ORAL | 5 refills | Status: DC

## 2017-12-29 NOTE — Telephone Encounter (Signed)
Roy Obrien, he is a Software engineer at [employer]. He was documenting that we are aware of coronary interaction with donepezil and quetiapine. I advised him Dr Tomi Likens is, benefits outweigh the risks.

## 2017-12-29 NOTE — Telephone Encounter (Signed)
Shanon Brow called and left a voicemail on this patient talking about medication. Please call him back at [phone]. I'm not sure where he's calling from or if he's a family member. Thanks!

## 2017-12-29 NOTE — Telephone Encounter (Signed)
Spoke with wife. I questioned if she started the 50 mg Seroquel BID as noted on 12/24/17, she had not, stating did not have enough medication to do that. I advised her he was to take 2, 25mg  and we could call in another Rx if he tolerated that well. I advised her I will send in new Rx

## 2017-12-30 ENCOUNTER — Telehealth: Payer: Self-pay | Admitting: Neurology

## 2017-12-30 NOTE — Telephone Encounter (Signed)
Called and spoke with Roy Obrien. She went to pick up Rx for increased dose of quetiapine, it was for 50 mg on once daily. I apologized to her, I advised her I entered it incorrectly and I will contact [facility] and have it corrected to 50mg  BID. Called [facility], spoke with [name], she took correction.

## 2017-12-30 NOTE — Telephone Encounter (Signed)
Patient's wife called and is needing to talk with you regarding her husbands prescription. Thanks

## 2017-12-30 NOTE — Telephone Encounter (Signed)
Patient's wife called and left a voicemail stating that she needed to speak to someone about his prescription. Please call her back at [phone]. Thanks!
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
## 2018-02-22 ENCOUNTER — Ambulatory Visit (INDEPENDENT_AMBULATORY_CARE_PROVIDER_SITE_OTHER): Payer: Medicare Other | Admitting: Neurology

## 2018-02-22 ENCOUNTER — Encounter: Payer: Self-pay | Admitting: Neurology

## 2018-02-22 VITALS — BP 146/88 | HR 64 | Ht 72.0 in | Wt 192.0 lb

## 2018-02-22 DIAGNOSIS — Z79899 Other long term (current) drug therapy: Secondary | ICD-10-CM | POA: Diagnosis not present

## 2018-02-22 DIAGNOSIS — G301 Alzheimer's disease with late onset: Secondary | ICD-10-CM

## 2018-02-22 DIAGNOSIS — F0280 Dementia in other diseases classified elsewhere without behavioral disturbance: Secondary | ICD-10-CM | POA: Diagnosis not present

## 2018-02-22 DIAGNOSIS — F0391 Unspecified dementia with behavioral disturbance: Secondary | ICD-10-CM | POA: Diagnosis not present

## 2018-02-22 DIAGNOSIS — G309 Alzheimer's disease, unspecified: Secondary | ICD-10-CM | POA: Diagnosis not present

## 2018-02-22 DIAGNOSIS — R451 Restlessness and agitation: Secondary | ICD-10-CM | POA: Diagnosis not present

## 2018-02-22 DIAGNOSIS — F419 Anxiety disorder, unspecified: Secondary | ICD-10-CM | POA: Diagnosis not present

## 2018-02-22 DIAGNOSIS — F0281 Dementia in other diseases classified elsewhere with behavioral disturbance: Secondary | ICD-10-CM | POA: Diagnosis not present

## 2018-02-22 DIAGNOSIS — F411 Generalized anxiety disorder: Secondary | ICD-10-CM | POA: Diagnosis not present

## 2018-02-22 NOTE — Patient Instructions (Addendum)
Recommend going to the ED at [facility] in [place].  I think he needs to be admitted to acutely treat his agitation. I advise he should no longer drive.  Please refer to the [facility] website and there should be a form you can fill out to report concern for driving safety.

## 2018-02-24 ENCOUNTER — Telehealth: Payer: Self-pay | Admitting: Neurology

## 2018-02-24 NOTE — Telephone Encounter (Signed)
Patient's wife called and would like to speak with you regarding Roy Obrien. Please Call. Thanks

## 2018-02-24 NOTE — Telephone Encounter (Signed)
Called and spoke with [name], she put the call on speaker with her daughter Seth Bake there. The daughter called earlier about the Pt, I was unable to talk with her, she is not on the [facility]. Pt was seen here in the office on Monday 02/22/18, the family was instructed to take the Pt to [facility] where he could be admitted for inpatient psychiatric care. Though this was verbalized to them and printed on the AVS, the family was upset and took him to [place]. He was admitted through the ED and taken to the psych unit. His daughter states he is in a 2 x 2 room. [name] and Seth Bake wanted to know if Dr Tomi Likens could do anything to have the Pt transferred. I advised them at this point, with the actions the Pt was exhibiting, both he and [name] are safer until the evaluation can be completed. Seth Bake did say she had spoken with someone this morning about a Education officer, museum coming to talk to them about having the Pt transferred.

## 2018-02-24 NOTE — Telephone Encounter (Signed)
[name] called regarding this patient. She said after his appointment here on Monday 11/4 he was then taken to [facility] in [place]. She said he is in a 2 x 2 Room. She is needing some help and advise as to what to do? She said he went straight from the ER to the [facility]. Please Call [phone]. Thanks

## 2018-02-24 NOTE — Telephone Encounter (Signed)
Seth Bake, the daughter returned my call, I advised her I am unable to talk with her. She is to have her mother call. She verbalized her understanding

## 2018-02-24 NOTE — Telephone Encounter (Signed)
Returnd call to Seth Bake. Unfortunately, she is not on the DPR, I did LMOVM for her to call back

## 2018-02-26 DIAGNOSIS — J690 Pneumonitis due to inhalation of food and vomit: Secondary | ICD-10-CM | POA: Diagnosis not present

## 2018-02-26 DIAGNOSIS — E538 Deficiency of other specified B group vitamins: Secondary | ICD-10-CM | POA: Diagnosis not present

## 2018-02-26 DIAGNOSIS — E441 Mild protein-calorie malnutrition: Secondary | ICD-10-CM | POA: Diagnosis not present

## 2018-02-26 DIAGNOSIS — I82441 Acute embolism and thrombosis of right tibial vein: Secondary | ICD-10-CM | POA: Diagnosis present

## 2018-02-26 DIAGNOSIS — R0602 Shortness of breath: Secondary | ICD-10-CM | POA: Diagnosis not present

## 2018-02-26 DIAGNOSIS — R918 Other nonspecific abnormal finding of lung field: Secondary | ICD-10-CM | POA: Diagnosis not present

## 2018-02-26 DIAGNOSIS — R079 Chest pain, unspecified: Secondary | ICD-10-CM | POA: Diagnosis not present

## 2018-02-26 DIAGNOSIS — L219 Seborrheic dermatitis, unspecified: Secondary | ICD-10-CM | POA: Diagnosis not present

## 2018-02-26 DIAGNOSIS — E7849 Other hyperlipidemia: Secondary | ICD-10-CM | POA: Diagnosis not present

## 2018-02-26 DIAGNOSIS — I82401 Acute embolism and thrombosis of unspecified deep veins of right lower extremity: Secondary | ICD-10-CM | POA: Diagnosis not present

## 2018-02-26 DIAGNOSIS — Z66 Do not resuscitate: Secondary | ICD-10-CM | POA: Diagnosis not present

## 2018-02-26 DIAGNOSIS — J181 Lobar pneumonia, unspecified organism: Secondary | ICD-10-CM | POA: Diagnosis not present

## 2018-02-26 DIAGNOSIS — L218 Other seborrheic dermatitis: Secondary | ICD-10-CM | POA: Diagnosis present

## 2018-02-26 DIAGNOSIS — J3089 Other allergic rhinitis: Secondary | ICD-10-CM | POA: Diagnosis not present

## 2018-02-26 DIAGNOSIS — G309 Alzheimer's disease, unspecified: Secondary | ICD-10-CM | POA: Diagnosis not present

## 2018-02-26 DIAGNOSIS — R112 Nausea with vomiting, unspecified: Secondary | ICD-10-CM | POA: Diagnosis not present

## 2018-02-26 DIAGNOSIS — G301 Alzheimer's disease with late onset: Secondary | ICD-10-CM | POA: Diagnosis not present

## 2018-02-26 DIAGNOSIS — K5900 Constipation, unspecified: Secondary | ICD-10-CM | POA: Diagnosis not present

## 2018-02-26 DIAGNOSIS — I771 Stricture of artery: Secondary | ICD-10-CM | POA: Diagnosis not present

## 2018-02-26 DIAGNOSIS — I10 Essential (primary) hypertension: Secondary | ICD-10-CM | POA: Diagnosis not present

## 2018-02-26 DIAGNOSIS — R1312 Dysphagia, oropharyngeal phase: Secondary | ICD-10-CM | POA: Diagnosis not present

## 2018-02-26 DIAGNOSIS — I82411 Acute embolism and thrombosis of right femoral vein: Secondary | ICD-10-CM | POA: Diagnosis present

## 2018-02-26 DIAGNOSIS — N179 Acute kidney failure, unspecified: Secondary | ICD-10-CM | POA: Diagnosis not present

## 2018-02-26 DIAGNOSIS — F0281 Dementia in other diseases classified elsewhere with behavioral disturbance: Secondary | ICD-10-CM | POA: Diagnosis not present

## 2018-02-26 DIAGNOSIS — R109 Unspecified abdominal pain: Secondary | ICD-10-CM | POA: Diagnosis not present

## 2018-02-26 DIAGNOSIS — I82491 Acute embolism and thrombosis of other specified deep vein of right lower extremity: Secondary | ICD-10-CM | POA: Diagnosis not present

## 2018-02-26 DIAGNOSIS — J9811 Atelectasis: Secondary | ICD-10-CM | POA: Diagnosis not present

## 2018-02-26 DIAGNOSIS — L270 Generalized skin eruption due to drugs and medicaments taken internally: Secondary | ICD-10-CM | POA: Diagnosis not present

## 2018-02-26 DIAGNOSIS — E870 Hyperosmolality and hypernatremia: Secondary | ICD-10-CM | POA: Diagnosis not present

## 2018-02-26 DIAGNOSIS — H409 Unspecified glaucoma: Secondary | ICD-10-CM | POA: Diagnosis not present

## 2018-02-26 DIAGNOSIS — M7989 Other specified soft tissue disorders: Secondary | ICD-10-CM | POA: Diagnosis not present

## 2018-02-26 DIAGNOSIS — F29 Unspecified psychosis not due to a substance or known physiological condition: Secondary | ICD-10-CM | POA: Diagnosis not present

## 2018-02-26 DIAGNOSIS — R0789 Other chest pain: Secondary | ICD-10-CM | POA: Diagnosis not present

## 2018-02-26 DIAGNOSIS — F0391 Unspecified dementia with behavioral disturbance: Secondary | ICD-10-CM | POA: Diagnosis not present

## 2018-02-26 DIAGNOSIS — E860 Dehydration: Secondary | ICD-10-CM | POA: Diagnosis not present

## 2018-02-26 DIAGNOSIS — R21 Rash and other nonspecific skin eruption: Secondary | ICD-10-CM | POA: Diagnosis not present

## 2018-02-26 DIAGNOSIS — R740 Nonspecific elevation of levels of transaminase and lactic acid dehydrogenase [LDH]: Secondary | ICD-10-CM | POA: Diagnosis not present

## 2018-02-26 DIAGNOSIS — J189 Pneumonia, unspecified organism: Secondary | ICD-10-CM | POA: Diagnosis not present

## 2018-02-26 DIAGNOSIS — I69391 Dysphagia following cerebral infarction: Secondary | ICD-10-CM | POA: Diagnosis not present

## 2018-02-26 DIAGNOSIS — D519 Vitamin B12 deficiency anemia, unspecified: Secondary | ICD-10-CM | POA: Diagnosis not present

## 2018-02-26 DIAGNOSIS — Z79899 Other long term (current) drug therapy: Secondary | ICD-10-CM | POA: Diagnosis not present

## 2018-02-26 DIAGNOSIS — R1310 Dysphagia, unspecified: Secondary | ICD-10-CM | POA: Diagnosis not present

## 2018-02-26 DIAGNOSIS — M6281 Muscle weakness (generalized): Secondary | ICD-10-CM | POA: Diagnosis not present

## 2018-02-26 DIAGNOSIS — R8290 Unspecified abnormal findings in urine: Secondary | ICD-10-CM | POA: Diagnosis not present

## 2018-02-26 DIAGNOSIS — T421X5A Adverse effect of iminostilbenes, initial encounter: Secondary | ICD-10-CM | POA: Diagnosis not present

## 2018-02-26 DIAGNOSIS — R05 Cough: Secondary | ICD-10-CM | POA: Diagnosis not present

## 2018-02-26 DIAGNOSIS — I82409 Acute embolism and thrombosis of unspecified deep veins of unspecified lower extremity: Secondary | ICD-10-CM | POA: Diagnosis not present

## 2018-02-26 DIAGNOSIS — D696 Thrombocytopenia, unspecified: Secondary | ICD-10-CM | POA: Diagnosis not present

## 2018-02-26 DIAGNOSIS — R509 Fever, unspecified: Secondary | ICD-10-CM | POA: Diagnosis not present

## 2018-02-26 DIAGNOSIS — L299 Pruritus, unspecified: Secondary | ICD-10-CM | POA: Diagnosis not present

## 2018-02-26 DIAGNOSIS — E876 Hypokalemia: Secondary | ICD-10-CM | POA: Diagnosis not present

## 2018-02-26 DIAGNOSIS — I82451 Acute embolism and thrombosis of right peroneal vein: Secondary | ICD-10-CM | POA: Diagnosis present

## 2018-02-26 DIAGNOSIS — Z515 Encounter for palliative care: Secondary | ICD-10-CM | POA: Diagnosis not present

## 2018-02-26 DIAGNOSIS — Z7189 Other specified counseling: Secondary | ICD-10-CM | POA: Diagnosis not present

## 2018-02-26 DIAGNOSIS — R1313 Dysphagia, pharyngeal phase: Secondary | ICD-10-CM | POA: Diagnosis not present

## 2018-04-12 DIAGNOSIS — K5900 Constipation, unspecified: Secondary | ICD-10-CM | POA: Diagnosis not present

## 2018-04-12 DIAGNOSIS — F419 Anxiety disorder, unspecified: Secondary | ICD-10-CM | POA: Diagnosis not present

## 2018-04-12 DIAGNOSIS — F29 Unspecified psychosis not due to a substance or known physiological condition: Secondary | ICD-10-CM | POA: Diagnosis not present

## 2018-04-12 DIAGNOSIS — R21 Rash and other nonspecific skin eruption: Secondary | ICD-10-CM | POA: Diagnosis not present

## 2018-04-12 DIAGNOSIS — I82401 Acute embolism and thrombosis of unspecified deep veins of right lower extremity: Secondary | ICD-10-CM | POA: Diagnosis not present

## 2018-04-12 DIAGNOSIS — R5381 Other malaise: Secondary | ICD-10-CM | POA: Diagnosis not present

## 2018-04-12 DIAGNOSIS — L299 Pruritus, unspecified: Secondary | ICD-10-CM | POA: Diagnosis not present

## 2018-04-12 DIAGNOSIS — F0281 Dementia in other diseases classified elsewhere with behavioral disturbance: Secondary | ICD-10-CM | POA: Diagnosis not present

## 2018-04-12 DIAGNOSIS — D519 Vitamin B12 deficiency anemia, unspecified: Secondary | ICD-10-CM | POA: Diagnosis not present

## 2018-04-12 DIAGNOSIS — I82409 Acute embolism and thrombosis of unspecified deep veins of unspecified lower extremity: Secondary | ICD-10-CM | POA: Diagnosis not present

## 2018-04-12 DIAGNOSIS — I82491 Acute embolism and thrombosis of other specified deep vein of right lower extremity: Secondary | ICD-10-CM | POA: Diagnosis not present

## 2018-04-12 DIAGNOSIS — G301 Alzheimer's disease with late onset: Secondary | ICD-10-CM | POA: Diagnosis not present

## 2018-04-12 DIAGNOSIS — J189 Pneumonia, unspecified organism: Secondary | ICD-10-CM | POA: Diagnosis not present

## 2018-04-12 DIAGNOSIS — R451 Restlessness and agitation: Secondary | ICD-10-CM | POA: Diagnosis not present

## 2018-04-12 DIAGNOSIS — Z515 Encounter for palliative care: Secondary | ICD-10-CM | POA: Diagnosis not present

## 2018-04-12 DIAGNOSIS — R1312 Dysphagia, oropharyngeal phase: Secondary | ICD-10-CM | POA: Diagnosis not present

## 2018-04-12 DIAGNOSIS — E870 Hyperosmolality and hypernatremia: Secondary | ICD-10-CM | POA: Diagnosis not present

## 2018-04-12 DIAGNOSIS — E7849 Other hyperlipidemia: Secondary | ICD-10-CM | POA: Diagnosis not present

## 2018-04-12 DIAGNOSIS — R4189 Other symptoms and signs involving cognitive functions and awareness: Secondary | ICD-10-CM | POA: Diagnosis not present

## 2018-04-12 DIAGNOSIS — G309 Alzheimer's disease, unspecified: Secondary | ICD-10-CM | POA: Diagnosis not present

## 2018-04-12 DIAGNOSIS — G4700 Insomnia, unspecified: Secondary | ICD-10-CM | POA: Diagnosis not present

## 2018-04-12 DIAGNOSIS — M6281 Muscle weakness (generalized): Secondary | ICD-10-CM | POA: Diagnosis not present

## 2018-04-12 DIAGNOSIS — I69391 Dysphagia following cerebral infarction: Secondary | ICD-10-CM | POA: Diagnosis not present

## 2018-04-12 DIAGNOSIS — J690 Pneumonitis due to inhalation of food and vomit: Secondary | ICD-10-CM | POA: Diagnosis not present

## 2018-04-12 DIAGNOSIS — E785 Hyperlipidemia, unspecified: Secondary | ICD-10-CM | POA: Diagnosis not present

## 2018-04-12 DIAGNOSIS — Z7189 Other specified counseling: Secondary | ICD-10-CM | POA: Diagnosis not present

## 2018-04-12 DIAGNOSIS — M25519 Pain in unspecified shoulder: Secondary | ICD-10-CM | POA: Diagnosis not present

## 2018-04-12 DIAGNOSIS — F0391 Unspecified dementia with behavioral disturbance: Secondary | ICD-10-CM | POA: Diagnosis not present

## 2018-04-12 DIAGNOSIS — R740 Nonspecific elevation of levels of transaminase and lactic acid dehydrogenase [LDH]: Secondary | ICD-10-CM | POA: Diagnosis not present

## 2018-04-12 DIAGNOSIS — I10 Essential (primary) hypertension: Secondary | ICD-10-CM | POA: Diagnosis not present

## 2018-04-13 DIAGNOSIS — R1312 Dysphagia, oropharyngeal phase: Secondary | ICD-10-CM | POA: Diagnosis not present

## 2018-04-13 DIAGNOSIS — E785 Hyperlipidemia, unspecified: Secondary | ICD-10-CM | POA: Diagnosis not present

## 2018-04-13 DIAGNOSIS — G301 Alzheimer's disease with late onset: Secondary | ICD-10-CM | POA: Diagnosis not present

## 2018-04-13 DIAGNOSIS — I10 Essential (primary) hypertension: Secondary | ICD-10-CM | POA: Diagnosis not present

## 2018-04-16 DIAGNOSIS — F29 Unspecified psychosis not due to a substance or known physiological condition: Secondary | ICD-10-CM | POA: Diagnosis not present

## 2018-04-16 DIAGNOSIS — R21 Rash and other nonspecific skin eruption: Secondary | ICD-10-CM | POA: Diagnosis not present

## 2018-04-16 DIAGNOSIS — F0391 Unspecified dementia with behavioral disturbance: Secondary | ICD-10-CM | POA: Diagnosis not present

## 2018-04-16 DIAGNOSIS — F419 Anxiety disorder, unspecified: Secondary | ICD-10-CM | POA: Diagnosis not present

## 2018-04-16 DIAGNOSIS — L299 Pruritus, unspecified: Secondary | ICD-10-CM | POA: Diagnosis not present

## 2018-04-19 DIAGNOSIS — G4700 Insomnia, unspecified: Secondary | ICD-10-CM | POA: Diagnosis not present

## 2018-04-19 DIAGNOSIS — F0391 Unspecified dementia with behavioral disturbance: Secondary | ICD-10-CM | POA: Diagnosis not present

## 2018-04-19 DIAGNOSIS — F419 Anxiety disorder, unspecified: Secondary | ICD-10-CM | POA: Diagnosis not present

## 2018-04-19 DIAGNOSIS — L299 Pruritus, unspecified: Secondary | ICD-10-CM | POA: Diagnosis not present

## 2018-04-19 DIAGNOSIS — R21 Rash and other nonspecific skin eruption: Secondary | ICD-10-CM | POA: Diagnosis not present

## 2018-04-27 DIAGNOSIS — R5381 Other malaise: Secondary | ICD-10-CM | POA: Diagnosis not present

## 2018-04-27 DIAGNOSIS — R4189 Other symptoms and signs involving cognitive functions and awareness: Secondary | ICD-10-CM | POA: Diagnosis not present

## 2018-04-27 DIAGNOSIS — M6281 Muscle weakness (generalized): Secondary | ICD-10-CM | POA: Diagnosis not present

## 2018-04-27 DIAGNOSIS — L299 Pruritus, unspecified: Secondary | ICD-10-CM | POA: Diagnosis not present

## 2018-04-27 DIAGNOSIS — R21 Rash and other nonspecific skin eruption: Secondary | ICD-10-CM | POA: Diagnosis not present

## 2018-04-27 DIAGNOSIS — F419 Anxiety disorder, unspecified: Secondary | ICD-10-CM | POA: Diagnosis not present

## 2018-04-27 DIAGNOSIS — M25519 Pain in unspecified shoulder: Secondary | ICD-10-CM | POA: Diagnosis not present

## 2018-04-27 DIAGNOSIS — F0391 Unspecified dementia with behavioral disturbance: Secondary | ICD-10-CM | POA: Diagnosis not present

## 2018-04-28 ENCOUNTER — Other Ambulatory Visit: Payer: Self-pay | Admitting: *Deleted

## 2018-04-28 NOTE — Patient Outreach (Signed)
[facility]) Care Management  04/28/2018  JAYCION TREML [birth year] [id number]   Onsite visit at [facility]. Met with Marita Kansas the discharge planner.  She reports patient is discharging 04/29/18 to a memory care facility.  No THN care management needs identified. Plan to sign off. Royetta Crochet. Laymond Purser, MSN, RN, [place] [phone]) Business Cell  [phone]) Toll Free Office

## 2018-04-29 ENCOUNTER — Telehealth: Payer: Self-pay | Admitting: Licensed Clinical Social Worker

## 2018-04-29 ENCOUNTER — Non-Acute Institutional Stay: Payer: Medicare Other | Admitting: Licensed Clinical Social Worker

## 2018-04-29 ENCOUNTER — Encounter: Payer: Self-pay | Admitting: Licensed Clinical Social Worker

## 2018-04-29 DIAGNOSIS — F0391 Unspecified dementia with behavioral disturbance: Secondary | ICD-10-CM | POA: Diagnosis not present

## 2018-04-29 DIAGNOSIS — Z515 Encounter for palliative care: Secondary | ICD-10-CM

## 2018-04-29 DIAGNOSIS — F419 Anxiety disorder, unspecified: Secondary | ICD-10-CM | POA: Diagnosis not present

## 2018-04-29 DIAGNOSIS — R21 Rash and other nonspecific skin eruption: Secondary | ICD-10-CM | POA: Diagnosis not present

## 2018-04-29 DIAGNOSIS — L299 Pruritus, unspecified: Secondary | ICD-10-CM | POA: Diagnosis not present

## 2018-04-29 NOTE — Telephone Encounter (Signed)
Palliative Care SW spoke with patient's wife, [name], and informed her of the visit SW had with patient.  Provided education regarding the Palliative Care/THN program and she stated she understood.  The couple has been married for 59 years.  She cared for patient for two years and is currently seeking memory care placement.  Patient was receiving therapy at [facility].

## 2018-04-29 NOTE — Progress Notes (Signed)
A COMMUNITY PALLIATIVE CARE SW NOTE  PATIENT NAME: Roy Obrien DOB: [birth year] MRN: [id number]  PRIMARY CARE PROVIDER: Laurey Morale, MD  RESPONSIBLE PARTY:  Acct ID - Guarantor Home Phone Work Phone Relationship Acct Type  [id number] [name] Self P/F     [address] [name], [place] [address]     PLAN OF CARE and INTERVENTIONS:             1. GOALS OF CARE/ ADVANCE CARE PLANNING:  Patient's wife, Roy Obrien, wishes for patient to have the best quality of life possible.  He is a full code. 2. SOCIAL/EMOTIONAL/SPIRITUAL ASSESSMENT/ INTERVENTIONS:  SW met with patient at [facility].  He was admitted to the facility on 04/13/19, from [facility].  He was sitting in the lobby.  Patient was alert and oriented to self.  He engaged in conversation and was able to answer some questions appropriately.  He was born in [place], [place], and graduated from high school.  He was unable to state his past employment and had difficulty finding words.  SW also consulted the facility nurse, Charlene.  She stated he is very mobile within the facility. 3. PATIENT/CAREGIVER EDUCATION/ COPING:  Patient and his wife express their feelings openly. 4. PERSONAL EMERGENCY PLAN:  Per facility protocol. 5. COMMUNITY RESOURCES COORDINATION/ HEALTH CARE NAVIGATION:  None. 6. FINANCIAL/LEGAL CONCERNS/INTERVENTIONS:  Patient's wife expressed future financial concerns.     SOCIAL HX:  Social History   Tobacco Use  . Smoking status: Never Smoker  . Smokeless tobacco: Never Used  Substance Use Topics  . Alcohol use: No    Alcohol/week: 0.0 standard drinks    CODE STATUS:  Full Code  ADVANCED DIRECTIVES: N MOST FORM COMPLETE:  N HOSPICE EDUCATION PROVIDED:  N PPS:  Patient's appetite is normal.  He is able to stand and ambulate independently. Duration of visit and documentation:  45 minutes.      Creola Corn Jalaiyah Throgmorton, LCSW

## 2018-05-03 ENCOUNTER — Telehealth: Payer: Self-pay | Admitting: Licensed Clinical Social Worker

## 2018-05-03 DIAGNOSIS — I10 Essential (primary) hypertension: Secondary | ICD-10-CM | POA: Diagnosis not present

## 2018-05-03 DIAGNOSIS — R21 Rash and other nonspecific skin eruption: Secondary | ICD-10-CM | POA: Diagnosis not present

## 2018-05-03 DIAGNOSIS — L299 Pruritus, unspecified: Secondary | ICD-10-CM | POA: Diagnosis not present

## 2018-05-03 DIAGNOSIS — F0391 Unspecified dementia with behavioral disturbance: Secondary | ICD-10-CM | POA: Diagnosis not present

## 2018-05-03 DIAGNOSIS — R451 Restlessness and agitation: Secondary | ICD-10-CM | POA: Diagnosis not present

## 2018-05-03 NOTE — Telephone Encounter (Signed)
SW returned call from patient's daughter, [name].  She stated patient will be moving from [facility] to [facility] on [address] this week.  SW provided education regarding the Palliative Care/THN program and she stated she understood.  She would like to be contacted after the next visit.

## 2018-05-09 ENCOUNTER — Emergency Department (HOSPITAL_COMMUNITY): Payer: Medicare Other

## 2018-05-09 ENCOUNTER — Emergency Department (HOSPITAL_COMMUNITY)
Admission: EM | Admit: 2018-05-09 | Discharge: 2018-05-09 | Disposition: A | Discharge status: Home / Self Care | Payer: Medicare Other | Attending: Emergency Medicine | Admitting: Emergency Medicine

## 2018-05-09 ENCOUNTER — Encounter (HOSPITAL_COMMUNITY): Payer: Self-pay | Admitting: Emergency Medicine

## 2018-05-09 DIAGNOSIS — Y939 Activity, unspecified: Secondary | ICD-10-CM | POA: Insufficient documentation

## 2018-05-09 DIAGNOSIS — W19XXXA Unspecified fall, initial encounter: Secondary | ICD-10-CM | POA: Insufficient documentation

## 2018-05-09 DIAGNOSIS — M2550 Pain in unspecified joint: Secondary | ICD-10-CM | POA: Diagnosis not present

## 2018-05-09 DIAGNOSIS — Y929 Unspecified place or not applicable: Secondary | ICD-10-CM | POA: Insufficient documentation

## 2018-05-09 DIAGNOSIS — I959 Hypotension, unspecified: Secondary | ICD-10-CM | POA: Diagnosis not present

## 2018-05-09 DIAGNOSIS — S0003XA Contusion of scalp, initial encounter: Secondary | ICD-10-CM | POA: Diagnosis not present

## 2018-05-09 DIAGNOSIS — S01111A Laceration without foreign body of right eyelid and periocular area, initial encounter: Secondary | ICD-10-CM | POA: Diagnosis not present

## 2018-05-09 DIAGNOSIS — F0390 Unspecified dementia without behavioral disturbance: Secondary | ICD-10-CM | POA: Insufficient documentation

## 2018-05-09 DIAGNOSIS — Y999 Unspecified external cause status: Secondary | ICD-10-CM | POA: Insufficient documentation

## 2018-05-09 DIAGNOSIS — I10 Essential (primary) hypertension: Secondary | ICD-10-CM | POA: Insufficient documentation

## 2018-05-09 DIAGNOSIS — S199XXA Unspecified injury of neck, initial encounter: Secondary | ICD-10-CM | POA: Diagnosis not present

## 2018-05-09 DIAGNOSIS — S0990XA Unspecified injury of head, initial encounter: Secondary | ICD-10-CM | POA: Diagnosis not present

## 2018-05-09 DIAGNOSIS — S0993XA Unspecified injury of face, initial encounter: Secondary | ICD-10-CM | POA: Diagnosis not present

## 2018-05-09 DIAGNOSIS — R58 Hemorrhage, not elsewhere classified: Secondary | ICD-10-CM | POA: Diagnosis not present

## 2018-05-09 DIAGNOSIS — Z7401 Bed confinement status: Secondary | ICD-10-CM | POA: Diagnosis not present

## 2018-05-09 DIAGNOSIS — S3993XA Unspecified injury of pelvis, initial encounter: Secondary | ICD-10-CM | POA: Diagnosis not present

## 2018-05-09 DIAGNOSIS — M25551 Pain in right hip: Secondary | ICD-10-CM | POA: Diagnosis not present

## 2018-05-09 DIAGNOSIS — R4182 Altered mental status, unspecified: Secondary | ICD-10-CM | POA: Diagnosis not present

## 2018-05-09 LAB — I-STAT CHEM 8, ED
BUN: 13 mg/dL (ref 8–23)
CREATININE: 0.9 mg/dL (ref 0.61–1.24)
Calcium, Ion: 1.08 mmol/L — ABNORMAL LOW (ref 1.15–1.40)
Chloride: 104 mmol/L (ref 98–111)
Glucose, Bld: 115 mg/dL — ABNORMAL HIGH (ref 70–99)
HCT: 31 % — ABNORMAL LOW (ref 39.0–52.0)
Hemoglobin: 10.5 g/dL — ABNORMAL LOW (ref 13.0–17.0)
Potassium: 3.7 mmol/L (ref 3.5–5.1)
Sodium: 139 mmol/L (ref 135–145)
TCO2: 25 mmol/L (ref 22–32)

## 2018-05-09 MED ORDER — LIDOCAINE HCL (PF) 1 % IJ SOLN
5.0000 mL | Freq: Once | INTRAMUSCULAR | Status: DC
  Filled 2018-05-09: qty 30

## 2018-05-09 MED ORDER — BACITRACIN-NEOMYCIN-POLYMYXIN 400-5-5000 EX OINT: 1.0000 "application " | TOPICAL_OINTMENT | Freq: Two times a day (BID) | CUTANEOUS | 0 refills | Status: AC

## 2018-05-09 MED ORDER — BACITRACIN ZINC 500 UNIT/GM EX OINT: TOPICAL_OINTMENT | Freq: Two times a day (BID) | CUTANEOUS | Status: DC

## 2018-05-09 NOTE — ED Notes (Signed)
Bed: LE75 Expected date:  Expected time:  Means of arrival:  Comments: rm 7
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
## 2018-05-09 NOTE — ED Triage Notes (Signed)
Per EMS , pt.from [facility] with complaint f fall and facial laceration on right eyebrow. Staff found pt. Already on floor at the hallway. Pt. Stated that he tripped and fell and hit his head to the ground, unwitnessed. Pt. A/O x2. Bleeding controlled . C-collar in placed

## 2018-05-09 NOTE — ED Notes (Signed)
Bed: WA07 Expected date:  Expected time:  Means of arrival:  Comments: 83 yr old fall, eye laceration

## 2018-05-09 NOTE — ED Provider Notes (Signed)
Emergency Department Provider Note   I have reviewed the triage vital signs and the nursing notes.   HISTORY  Chief Complaint Fall and Facial Laceration   HPI MCCRAE SPECIALE is a 83 y.o. male who had an unwitnessed fall earlier today suffering a laceration of of his right eyebrow.  Patient recently had a bunch of medication changes associated with behavioral disturbance associated with dementia.  Patient states he has no pain elsewhere. No other associated or modifying symptoms.   Level V Caveat Secondary to Dementia.   Past Medical History:  Diagnosis Date  . ALLERGIC RHINITIS   . Allergy   . Anxiety   . Cataract   . Glaucoma    dr Tobe Sos,   . Hyperlipidemia   . Hypertension   . Tubular adenoma of colon 02/2006  . Vertigo, benign positional     Patient Active Problem List   Diagnosis Date Noted  . Osteoarthritis of both hands 03/24/2016  . Memory loss 07/24/2015  . Anxiety state 06/16/2012  . OSA (obstructive sleep apnea) 08/05/2010  . Mixed hyperlipidemia 07/22/2010  . Palpitations 07/22/2010  . Snoring 07/12/2010  . BENIGN POSITIONAL VERTIGO 10/03/2009  . ALLERGIC RHINITIS 06/26/2009  . BENIGN PROSTATIC HYPERTROPHY, WITH OBSTRUCTION 06/26/2009  . Eczema 06/26/2009  . COLONIC POLYPS, HX OF 06/26/2009  . Essential hypertension 12/09/2006    Past Surgical History:  Procedure Laterality Date  . COLONOSCOPY  03-18-06   per Dr. Fuller Plan, repeat in 5 yrs   . foot ablation Right    right foot nerve  . MOHS SURGERY Right 09/03/2006   basal cell carcinoma ear lobe - dr Izora Ribas    Current Outpatient Rx  . Order #: [id number] Class: Historical Med  . Order #: [id number] Class: Print    Allergies Patient has no known allergies.  Family History  Problem Relation Age of Onset  . Breast cancer Mother   . Coronary artery disease Mother   . Stroke Father   . Emphysema Father     Social History Social History   Tobacco Use  . Smoking  status: Never Smoker  . Smokeless tobacco: Never Used  Substance Use Topics  . Alcohol use: No    Alcohol/week: 0.0 standard drinks  . Drug use: No    Review of Systems  All other systems negative except as documented in the HPI. All pertinent positives and negatives as reviewed in the HPI. ____________________________________________   PHYSICAL EXAM:  VITAL SIGNS: ED Triage Vitals [05/09/18 0103]  Enc Vitals Group     BP 112/68     Pulse Rate 88     Resp 20     Temp 97.7 F (36.5 C)     Temp Source Oral     SpO2 97 %    Constitutional: Alert. Well appearing and in no acute distress. Eyes: Conjunctivae are normal. PERRL. EOMI. Head: 1 cm laceration to right eyebrow. Abrasion above Nose: No congestion/rhinnorhea. Mouth/Throat: Mucous membranes are moist.  Oropharynx non-erythematous. Neck: No stridor.  No meningeal signs.   Cardiovascular: Normal rate, regular rhythm. Good peripheral circulation. Grossly normal heart sounds.   Respiratory: Normal respiratory effort.  No retractions. Lungs CTAB. Gastrointestinal: Soft and nontender. No distention.  Musculoskeletal: No cervical spine tenderness, thoracic spine tenderness or Lumbar spine tenderness.  No tenderness or pain with palpation and full ROM of all joints in upper and lower extremities.  No ecchymosis or other signs of trauma on back or extremities.  No Pain with AP or  lateral compression of ribs.  No Paracervical ttp, paraspinal ttp Neurologic:  Normal speech. No gross focal neurologic deficits are appreciated.  Skin:  Skin is warm, dry and intact. No rash noted.  ____________________________________________   LABS (all labs ordered are listed, but only abnormal results are displayed)  Labs Reviewed  I-STAT CHEM 8, ED - Abnormal; Notable for the following components:      Result Value   Glucose, Bld 115 (*)    Calcium, Ion 1.08 (*)    Hemoglobin 10.5 (*)    HCT 31.0 (*)    All other components within  normal limits   ____________________________________________  EKG   EKG Interpretation  Date/Time:  Sunday May 09 2018 02:38:11 EST Ventricular Rate:  87 PR Interval:    QRS Duration: 73 QT Interval:  380 QTC Calculation: 458 R Axis:   68 Text Interpretation:  Sinus rhythm Baseline wander in lead(s) V3 V5 No significant change since last tracing Confirmed by Merrily Pew [phone]) on 05/09/2018 7:08:01 AM       ____________________________________________  RADIOLOGY  Dg Pelvis 1-2 Views  Result Date: 05/09/2018 CLINICAL DATA:  Initial evaluation for acute trauma, fall. Evaluate for fracture. EXAM: PELVIS - 1-2 VIEW COMPARISON:  None. FINDINGS: A subtle linear lucency extends through the intertrochanteric region of the right femur. While this may reflect summation of shadows due to overlying skin fold, a subtle acute nondisplaced fracture is difficult to exclude. No other acute fracture dislocation. SI joints approximated symmetric. No pubic diastasis. Bony pelvis intact. No abnormality about the left hip. Mild to moderate degenerative osteoarthrosis about the hips bilaterally. No appreciable soft tissue injury. IMPRESSION: 1. Subtle linear lucency extending through the intertrochanteric fracture of the right femur. While this could reflect summation of shadows due to overlying skin fold, a possible acute nondisplaced fracture is difficult to exclude. Correlation with physical exam recommended. Additionally, further assessment with dedicated radiograph of the right hip suggested for further evaluation. Electronically Signed   By: Jeannine Boga M.D.   On: 05/09/2018 02:22   Ct Head Wo Contrast  Result Date: 05/09/2018 CLINICAL DATA:  Initial evaluation for acute trauma, fall. EXAM: CT HEAD WITHOUT CONTRAST CT MAXILLOFACIAL WITHOUT CONTRAST CT CERVICAL SPINE WITHOUT CONTRAST TECHNIQUE: Multidetector CT imaging of the head, cervical spine, and maxillofacial structures were  performed using the standard protocol without intravenous contrast. Multiplanar CT image reconstructions of the cervical spine and maxillofacial structures were also generated. COMPARISON:  Previous MRI from 02/24/2016. FINDINGS: CT HEAD FINDINGS Brain: Mild age-related cerebral volume loss with chronic small vessel ischemic disease. No acute intracranial hemorrhage. No acute large vessel territory infarct. No mass lesion, midline shift or mass effect. No hydrocephalus. No extra-axial fluid collection. Vascular: No hyperdense vessel. Scattered vascular calcifications noted within the carotid siphons. Skull: Small right periorbital scalp contusion.  Calvarium intact. Other: Mastoid air cells are clear. CT MAXILLOFACIAL FINDINGS Osseous: Zygomatic arches intact. No acute maxillary fracture. Pterygoid plates intact. Nasal bones intact. Mild right-to-left nasal septal deviation without fracture. Mandible intact. Mandibular condyles normally situated. No acute abnormality about the dentition. Orbits: Globes and orbital soft tissues within normal limits. Bony orbits intact. Sinuses: Paranasal sinuses are clear.  No hemosinus. Soft tissues: Small right supraorbital/periorbital soft tissue contusion. No other appreciable soft tissue injury about the face. CT CERVICAL SPINE FINDINGS Alignment: Vertebral bodies normally aligned with preservation of the normal cervical lordosis. No listhesis or subluxation. Skull base and vertebrae: Normal C1-2 articulations are preserved in the dens is intact. Vertebral  body heights maintained. No acute fracture. Soft tissues and spinal canal: Soft tissues of the neck demonstrate no acute finding. No prevertebral edema. Spinal canal within normal limits. Vascular calcifications about the carotid bifurcations. Disc levels: Mild cervical spondylolysis at C5-6 and C6-7. Mild multilevel facet arthrosis, most notable at C4-5 on the left. Upper chest: Visualized upper chest demonstrates no acute  finding. Irregular pleuroparenchymal thickening noted at the right lung apex. Other: None. IMPRESSION: CT HEAD: 1. No acute intracranial abnormality. 2. Generalized age-related cerebral atrophy with mild chronic small vessel ischemic disease. CT MAXILLOFACIAL: 1. Small right supraorbital/periorbital soft tissue contusion. 2. No other acute maxillofacial injury. No fracture. Intact globes with no retro-orbital pathology. CT CERVICAL SPINE: 1. No acute traumatic injury within the cervical spine. 2. Mild degenerative spondylolysis at C5-6 and C6-7. Electronically Signed   By: Jeannine Boga M.D.   On: 05/09/2018 03:13   Ct Cervical Spine Wo Contrast  Result Date: 05/09/2018 CLINICAL DATA:  Initial evaluation for acute trauma, fall. EXAM: CT HEAD WITHOUT CONTRAST CT MAXILLOFACIAL WITHOUT CONTRAST CT CERVICAL SPINE WITHOUT CONTRAST TECHNIQUE: Multidetector CT imaging of the head, cervical spine, and maxillofacial structures were performed using the standard protocol without intravenous contrast. Multiplanar CT image reconstructions of the cervical spine and maxillofacial structures were also generated. COMPARISON:  Previous MRI from 02/24/2016. FINDINGS: CT HEAD FINDINGS Brain: Mild age-related cerebral volume loss with chronic small vessel ischemic disease. No acute intracranial hemorrhage. No acute large vessel territory infarct. No mass lesion, midline shift or mass effect. No hydrocephalus. No extra-axial fluid collection. Vascular: No hyperdense vessel. Scattered vascular calcifications noted within the carotid siphons. Skull: Small right periorbital scalp contusion.  Calvarium intact. Other: Mastoid air cells are clear. CT MAXILLOFACIAL FINDINGS Osseous: Zygomatic arches intact. No acute maxillary fracture. Pterygoid plates intact. Nasal bones intact. Mild right-to-left nasal septal deviation without fracture. Mandible intact. Mandibular condyles normally situated. No acute abnormality about the  dentition. Orbits: Globes and orbital soft tissues within normal limits. Bony orbits intact. Sinuses: Paranasal sinuses are clear.  No hemosinus. Soft tissues: Small right supraorbital/periorbital soft tissue contusion. No other appreciable soft tissue injury about the face. CT CERVICAL SPINE FINDINGS Alignment: Vertebral bodies normally aligned with preservation of the normal cervical lordosis. No listhesis or subluxation. Skull base and vertebrae: Normal C1-2 articulations are preserved in the dens is intact. Vertebral body heights maintained. No acute fracture. Soft tissues and spinal canal: Soft tissues of the neck demonstrate no acute finding. No prevertebral edema. Spinal canal within normal limits. Vascular calcifications about the carotid bifurcations. Disc levels: Mild cervical spondylolysis at C5-6 and C6-7. Mild multilevel facet arthrosis, most notable at C4-5 on the left. Upper chest: Visualized upper chest demonstrates no acute finding. Irregular pleuroparenchymal thickening noted at the right lung apex. Other: None. IMPRESSION: CT HEAD: 1. No acute intracranial abnormality. 2. Generalized age-related cerebral atrophy with mild chronic small vessel ischemic disease. CT MAXILLOFACIAL: 1. Small right supraorbital/periorbital soft tissue contusion. 2. No other acute maxillofacial injury. No fracture. Intact globes with no retro-orbital pathology. CT CERVICAL SPINE: 1. No acute traumatic injury within the cervical spine. 2. Mild degenerative spondylolysis at C5-6 and C6-7. Electronically Signed   By: Jeannine Boga M.D.   On: 05/09/2018 03:13   Dg Hip Unilat W Or Wo Pelvis 2-3 Views Right  Result Date: 05/09/2018 CLINICAL DATA:  Initial evaluation for possible fracture. EXAM: DG HIP (WITH OR WITHOUT PELVIS) 2-3V RIGHT COMPARISON:  Prior pelvic radiograph from earlier the same day. FINDINGS: Dedicated views  of the right hip demonstrate no acute fracture or dislocation. Previously seen linear  lucency extending through the intertrochanteric region not seen on this exam, suggesting that the prior finding reflected a superimposed skin fold. Femoral head in normal alignment within the acetabulum. Femoral head height maintained. Visualized bony pelvis intact. Moderate osteoarthritic changes about the hips. Visible soft tissues are normal. IMPRESSION: 1. No acute fracture or dislocation. Previously noted lucency on prior radiograph most consistent with a superimposed skin fold. 2. Moderate osteoarthritic changes about the right hip. Electronically Signed   By: Jeannine Boga M.D.   On: 05/09/2018 03:59   Ct Maxillofacial Wo Contrast  Result Date: 05/09/2018 CLINICAL DATA:  Initial evaluation for acute trauma, fall. EXAM: CT HEAD WITHOUT CONTRAST CT MAXILLOFACIAL WITHOUT CONTRAST CT CERVICAL SPINE WITHOUT CONTRAST TECHNIQUE: Multidetector CT imaging of the head, cervical spine, and maxillofacial structures were performed using the standard protocol without intravenous contrast. Multiplanar CT image reconstructions of the cervical spine and maxillofacial structures were also generated. COMPARISON:  Previous MRI from 02/24/2016. FINDINGS: CT HEAD FINDINGS Brain: Mild age-related cerebral volume loss with chronic small vessel ischemic disease. No acute intracranial hemorrhage. No acute large vessel territory infarct. No mass lesion, midline shift or mass effect. No hydrocephalus. No extra-axial fluid collection. Vascular: No hyperdense vessel. Scattered vascular calcifications noted within the carotid siphons. Skull: Small right periorbital scalp contusion.  Calvarium intact. Other: Mastoid air cells are clear. CT MAXILLOFACIAL FINDINGS Osseous: Zygomatic arches intact. No acute maxillary fracture. Pterygoid plates intact. Nasal bones intact. Mild right-to-left nasal septal deviation without fracture. Mandible intact. Mandibular condyles normally situated. No acute abnormality about the dentition.  Orbits: Globes and orbital soft tissues within normal limits. Bony orbits intact. Sinuses: Paranasal sinuses are clear.  No hemosinus. Soft tissues: Small right supraorbital/periorbital soft tissue contusion. No other appreciable soft tissue injury about the face. CT CERVICAL SPINE FINDINGS Alignment: Vertebral bodies normally aligned with preservation of the normal cervical lordosis. No listhesis or subluxation. Skull base and vertebrae: Normal C1-2 articulations are preserved in the dens is intact. Vertebral body heights maintained. No acute fracture. Soft tissues and spinal canal: Soft tissues of the neck demonstrate no acute finding. No prevertebral edema. Spinal canal within normal limits. Vascular calcifications about the carotid bifurcations. Disc levels: Mild cervical spondylolysis at C5-6 and C6-7. Mild multilevel facet arthrosis, most notable at C4-5 on the left. Upper chest: Visualized upper chest demonstrates no acute finding. Irregular pleuroparenchymal thickening noted at the right lung apex. Other: None. IMPRESSION: CT HEAD: 1. No acute intracranial abnormality. 2. Generalized age-related cerebral atrophy with mild chronic small vessel ischemic disease. CT MAXILLOFACIAL: 1. Small right supraorbital/periorbital soft tissue contusion. 2. No other acute maxillofacial injury. No fracture. Intact globes with no retro-orbital pathology. CT CERVICAL SPINE: 1. No acute traumatic injury within the cervical spine. 2. Mild degenerative spondylolysis at C5-6 and C6-7. Electronically Signed   By: Jeannine Boga M.D.   On: 05/09/2018 03:13    ____________________________________________   PROCEDURES  Procedure(s) performed:   Marland KitchenMarland KitchenLaceration Repair Date/Time: 05/09/2018 7:06 AM Performed by: Merrily Pew, MD Authorized by: Merrily Pew, MD   Consent:    Consent obtained:  Verbal   Consent given by:  Patient   Risks discussed:  Infection, need for additional repair, nerve damage, poor wound  healing, poor cosmetic result, pain, retained foreign body, tendon damage and vascular damage   Alternatives discussed:  No treatment, delayed treatment and observation Anesthesia (see MAR for exact dosages):    Anesthesia method:  None Laceration details:    Location:  Face   Face location:  R eyebrow   Length (cm):  1   Depth (mm):  2 Repair type:    Repair type:  Simple Pre-procedure details:    Preparation:  Patient was prepped and draped in usual sterile fashion and imaging obtained to evaluate for foreign bodies Exploration:    Wound exploration: wound explored through full range of motion and entire depth of wound probed and visualized   Treatment:    Area cleansed with:  Saline   Amount of cleaning:  Extensive   Irrigation solution:  Sterile water   Irrigation method:  Syringe   Visualized foreign bodies/material removed: no   Skin repair:    Repair method:  Sutures   Suture size:  4-0   Wound skin closure material used: vicryl rapide.   Suture technique:  Simple interrupted   Number of sutures:  1 Approximation:    Approximation:  Close Post-procedure details:    Dressing:  Antibiotic ointment   Patient tolerance of procedure:  Tolerated well, no immediate complications   ____________________________________________   INITIAL IMPRESSION / ASSESSMENT AND PLAN / ED COURSE  Unwitnessed fall however suspect is probably mechanical.  EKG unremarkable.  Labs okay with no evidence of dehydration, electrolyte abnormalities.  Has a couple abrasions and ecchymosis to his head but no obvious bony injuries.  Laceration sutured as above with absorbable suture.  Stable for discharge back to facility.   Pertinent labs & imaging results that were available during my care of the patient were reviewed by me and considered in my medical decision making (see chart for details).  ____________________________________________  FINAL CLINICAL IMPRESSION(S) / ED DIAGNOSES  Final  diagnoses:  Injury of head, initial encounter  Laceration of right eyebrow, initial encounter     MEDICATIONS GIVEN DURING THIS VISIT:  Medications  lidocaine (PF) (XYLOCAINE) 1 % injection 5 mL (has no administration in time range)  bacitracin ointment (has no administration in time range)     NEW OUTPATIENT MEDICATIONS STARTED DURING THIS VISIT:  New Prescriptions   NEOMYCIN-BACITRACIN-POLYMYXIN (NEOSPORIN) OINTMENT    Apply 1 application topically every 12 (twelve) hours. To abrasions and laceration on face until healed    Note:  This note was prepared with assistance of Dragon voice recognition software. Occasional wrong-word or sound-a-like substitutions may have occurred due to the inherent limitations of voice recognition software.   Laquanta Hummel, Corene Cornea, MD 05/09/18 [phone]

## 2018-05-09 NOTE — ED Notes (Signed)
0530 Report called to Quillian Quince at [facility]

## 2018-05-12 DIAGNOSIS — D518 Other vitamin B12 deficiency anemias: Secondary | ICD-10-CM | POA: Diagnosis not present

## 2018-05-12 DIAGNOSIS — G8929 Other chronic pain: Secondary | ICD-10-CM | POA: Diagnosis not present

## 2018-05-12 DIAGNOSIS — L853 Xerosis cutis: Secondary | ICD-10-CM | POA: Diagnosis not present

## 2018-05-12 DIAGNOSIS — F0391 Unspecified dementia with behavioral disturbance: Secondary | ICD-10-CM | POA: Diagnosis not present

## 2018-05-12 DIAGNOSIS — K5900 Constipation, unspecified: Secondary | ICD-10-CM | POA: Diagnosis not present

## 2018-05-17 DIAGNOSIS — F5105 Insomnia due to other mental disorder: Secondary | ICD-10-CM | POA: Diagnosis not present

## 2018-05-17 DIAGNOSIS — F064 Anxiety disorder due to known physiological condition: Secondary | ICD-10-CM | POA: Diagnosis not present

## 2018-05-17 DIAGNOSIS — F0391 Unspecified dementia with behavioral disturbance: Secondary | ICD-10-CM | POA: Diagnosis not present

## 2018-05-17 DIAGNOSIS — F321 Major depressive disorder, single episode, moderate: Secondary | ICD-10-CM | POA: Diagnosis not present

## 2018-05-21 DIAGNOSIS — G8929 Other chronic pain: Secondary | ICD-10-CM | POA: Diagnosis not present

## 2018-05-21 DIAGNOSIS — F0391 Unspecified dementia with behavioral disturbance: Secondary | ICD-10-CM | POA: Diagnosis not present

## 2018-05-21 DIAGNOSIS — I10 Essential (primary) hypertension: Secondary | ICD-10-CM | POA: Diagnosis not present

## 2018-05-21 DIAGNOSIS — F321 Major depressive disorder, single episode, moderate: Secondary | ICD-10-CM | POA: Diagnosis not present

## 2018-05-21 DIAGNOSIS — F064 Anxiety disorder due to known physiological condition: Secondary | ICD-10-CM | POA: Diagnosis not present

## 2018-05-25 ENCOUNTER — Non-Acute Institutional Stay: Payer: Medicare Other | Admitting: Licensed Clinical Social Worker

## 2018-05-25 DIAGNOSIS — Z515 Encounter for palliative care: Secondary | ICD-10-CM

## 2018-05-25 NOTE — Progress Notes (Signed)
COMMUNITY PALLIATIVE [facility] SW NOTE  PATIENT NAME: Roy Obrien DOB: [birth year] MRN: [id number]  PRIMARY Obrien PROVIDER: Laurey Morale, MD  RESPONSIBLE PARTY:  Acct ID - Guarantor Home Phone Work Phone Relationship Acct Type  [id number] Roy [phone] Self P/F     [place]., [place], [name] [address]     PLAN OF Obrien and INTERVENTIONS:             1. GOALS OF Obrien/ ADVANCE Obrien PLANNING:  Goal is for patient to have the best quality of life possible.  Patient has a DNR. 2. SOCIAL/EMOTIONAL/SPIRITUAL ASSESSMENT/ INTERVENTIONS:  SW met with patient at [place] [place].  Patient was initially attending a music activity.  He did not display any nonverbal indicators of pain.  SW then walked with patient to the dining room for his lunch.  He was very welcoming and displayed a bright affect.  He said he lived in [place] and that his parents were alive.  SW consulted Surveyor, quantity, [place], and the Director, [place].   3. PATIENT/CAREGIVER EDUCATION/ COPING:  SW provided education to facility staff regarding the Palliative Obrien program. 4. PERSONAL EMERGENCY PLAN:  Per facility protocol. 5. COMMUNITY RESOURCES COORDINATION/ HEALTH Obrien NAVIGATION:  None 6. FINANCIAL/LEGAL CONCERNS/INTERVENTIONS:  There are future financial concerns.     SOCIAL HX:  Social History   Tobacco Use  . Smoking status: Never Smoker  . Smokeless tobacco: Never Used  Substance Use Topics  . Alcohol use: No    Alcohol/week: 0.0 standard drinks    CODE STATUS:  DNR ADVANCED DIRECTIVES: N MOST FORM COMPLETE: N HOSPICE EDUCATION PROVIDED: N PPS: Patient's appetite is normal.  He is able to stand and ambulate independently.  He was unsteady at times walking in the facility today. Duration of visit and documentation:  50 minutes.      [place] , LCSW

## 2018-05-31 DIAGNOSIS — F321 Major depressive disorder, single episode, moderate: Secondary | ICD-10-CM | POA: Diagnosis not present

## 2018-05-31 DIAGNOSIS — F0391 Unspecified dementia with behavioral disturbance: Secondary | ICD-10-CM | POA: Diagnosis not present

## 2018-05-31 DIAGNOSIS — F5105 Insomnia due to other mental disorder: Secondary | ICD-10-CM | POA: Diagnosis not present

## 2018-05-31 DIAGNOSIS — F064 Anxiety disorder due to known physiological condition: Secondary | ICD-10-CM | POA: Diagnosis not present

## 2018-06-09 DIAGNOSIS — D518 Other vitamin B12 deficiency anemias: Secondary | ICD-10-CM | POA: Diagnosis not present

## 2018-06-09 DIAGNOSIS — L853 Xerosis cutis: Secondary | ICD-10-CM | POA: Diagnosis not present

## 2018-06-09 DIAGNOSIS — K5900 Constipation, unspecified: Secondary | ICD-10-CM | POA: Diagnosis not present

## 2018-06-09 DIAGNOSIS — G8929 Other chronic pain: Secondary | ICD-10-CM | POA: Diagnosis not present

## 2018-06-19 DIAGNOSIS — F064 Anxiety disorder due to known physiological condition: Secondary | ICD-10-CM | POA: Diagnosis not present

## 2018-06-19 DIAGNOSIS — F0391 Unspecified dementia with behavioral disturbance: Secondary | ICD-10-CM | POA: Diagnosis not present

## 2018-06-25 ENCOUNTER — Telehealth: Payer: Self-pay | Admitting: *Deleted

## 2018-06-25 NOTE — Telephone Encounter (Signed)
Copied from [place] [id number]. Topic: General - Inquiry >> Jun 25, 2018  3:03 PM Vernona Rieger wrote: Reason for CRM: Patient's daughter, Seth Bake called and said she is moving him in with her and she is going to be using a service called [facility] and they should be faxing something to Dr Sarajane Jews today. It needs to be filled out with his diagnosis of dementia so that they can provide services.

## 2018-06-28 DIAGNOSIS — F064 Anxiety disorder due to known physiological condition: Secondary | ICD-10-CM | POA: Diagnosis not present

## 2018-06-28 DIAGNOSIS — F0391 Unspecified dementia with behavioral disturbance: Secondary | ICD-10-CM | POA: Diagnosis not present

## 2018-06-28 DIAGNOSIS — F5105 Insomnia due to other mental disorder: Secondary | ICD-10-CM | POA: Diagnosis not present

## 2018-06-28 DIAGNOSIS — F321 Major depressive disorder, single episode, moderate: Secondary | ICD-10-CM | POA: Diagnosis not present

## 2018-06-28 NOTE — Telephone Encounter (Signed)
We have not received any forms at this time.  Will continue to watch for these forms to be faxed in.

## 2018-06-30 ENCOUNTER — Non-Acute Institutional Stay: Payer: Medicare Other | Admitting: Licensed Clinical Social Worker

## 2018-06-30 ENCOUNTER — Other Ambulatory Visit: Payer: Self-pay

## 2018-06-30 DIAGNOSIS — Z515 Encounter for palliative care: Secondary | ICD-10-CM

## 2018-06-30 NOTE — Progress Notes (Signed)
COMMUNITY PALLIATIVE CARE SW NOTE  PATIENT NAME: Roy Obrien DOB: 1935/05/14 MRN: [id number]  PRIMARY CARE PROVIDER: Laurey Morale, MD  RESPONSIBLE PARTY:  Acct ID - Guarantor Home Phone Work Phone Relationship Acct Type  [id number] [name] Self P/F     [address] [name], [place] [address]     PLAN OF CARE and INTERVENTIONS:             1. GOALS OF CARE/ ADVANCE CARE PLANNING:  Patient's daughter and wife wish him to have the best quality of life.  He has a DNR. 2. SOCIAL/EMOTIONAL/SPIRITUAL ASSESSMENT/ INTERVENTIONS:  SW met with patient at [facility].  He was attending a monthly birthday party with staff and other residents.  He displayed a bright affect and no signs of pain.  He was observed engaging with other residents.  Staff reports his symptoms are managed at this time.  He continues to display and express social niceties.  3. PATIENT/CAREGIVER EDUCATION/ COPING:  Patient copes by expressing his feelings openly. 4. PERSONAL EMERGENCY PLAN:  Per facility protocol. 5. COMMUNITY RESOURCES COORDINATION/ HEALTH CARE NAVIGATION:  None. 6. FINANCIAL/LEGAL CONCERNS/INTERVENTIONS:  None.     SOCIAL HX:  Social History   Tobacco Use  . Smoking status: Never Smoker  . Smokeless tobacco: Never Used  Substance Use Topics  . Alcohol use: No    Alcohol/week: 0.0 standard drinks    CODE STATUS:  DNR  ADVANCED DIRECTIVES: N MOST FORM COMPLETE:  N HOSPICE EDUCATION PROVIDED:  N PPS:  Patient's appetite is normal.  He stands and ambulates independently. Duration of visit and documentation:  45 minutes.      Creola Corn Dierra Riesgo, LCSW

## 2018-07-05 DIAGNOSIS — F0391 Unspecified dementia with behavioral disturbance: Secondary | ICD-10-CM | POA: Diagnosis not present

## 2018-07-05 DIAGNOSIS — F5105 Insomnia due to other mental disorder: Secondary | ICD-10-CM | POA: Diagnosis not present

## 2018-07-05 DIAGNOSIS — F321 Major depressive disorder, single episode, moderate: Secondary | ICD-10-CM | POA: Diagnosis not present

## 2018-07-05 DIAGNOSIS — F064 Anxiety disorder due to known physiological condition: Secondary | ICD-10-CM | POA: Diagnosis not present

## 2018-07-23 DIAGNOSIS — F0390 Unspecified dementia without behavioral disturbance: Secondary | ICD-10-CM | POA: Diagnosis not present

## 2018-07-23 DIAGNOSIS — Z136 Encounter for screening for cardiovascular disorders: Secondary | ICD-10-CM | POA: Diagnosis not present

## 2018-07-23 DIAGNOSIS — F3481 Disruptive mood dysregulation disorder: Secondary | ICD-10-CM | POA: Diagnosis not present

## 2018-07-23 DIAGNOSIS — R739 Hyperglycemia, unspecified: Secondary | ICD-10-CM | POA: Diagnosis not present

## 2018-07-23 DIAGNOSIS — G309 Alzheimer's disease, unspecified: Secondary | ICD-10-CM | POA: Diagnosis not present

## 2018-07-23 DIAGNOSIS — E538 Deficiency of other specified B group vitamins: Secondary | ICD-10-CM | POA: Diagnosis not present

## 2018-07-28 DIAGNOSIS — E039 Hypothyroidism, unspecified: Secondary | ICD-10-CM | POA: Diagnosis not present

## 2018-07-28 DIAGNOSIS — I10 Essential (primary) hypertension: Secondary | ICD-10-CM | POA: Diagnosis not present

## 2018-07-28 DIAGNOSIS — E119 Type 2 diabetes mellitus without complications: Secondary | ICD-10-CM | POA: Diagnosis not present

## 2018-07-28 DIAGNOSIS — D510 Vitamin B12 deficiency anemia due to intrinsic factor deficiency: Secondary | ICD-10-CM | POA: Diagnosis not present

## 2018-07-28 DIAGNOSIS — E785 Hyperlipidemia, unspecified: Secondary | ICD-10-CM | POA: Diagnosis not present

## 2018-07-28 DIAGNOSIS — E559 Vitamin D deficiency, unspecified: Secondary | ICD-10-CM | POA: Diagnosis not present

## 2018-08-17 ENCOUNTER — Telehealth: Payer: Self-pay | Admitting: Licensed Clinical Social Worker

## 2018-08-17 NOTE — Telephone Encounter (Signed)
Palliative Care SW left a vm with patient's wife to schedule a virtual check-in visit.

## 2018-08-23 ENCOUNTER — Ambulatory Visit: Payer: Medicare Other | Admitting: Neurology

## 2018-09-03 ENCOUNTER — Other Ambulatory Visit: Payer: Self-pay

## 2018-09-03 ENCOUNTER — Other Ambulatory Visit: Payer: Medicare Other | Admitting: Licensed Clinical Social Worker

## 2018-09-03 DIAGNOSIS — Z515 Encounter for palliative care: Secondary | ICD-10-CM

## 2018-09-06 NOTE — Progress Notes (Signed)
COMMUNITY PALLIATIVE CARE SW NOTE  PATIENT NAME: Roy Obrien DOB: 06-23-1935 MRN: [id number]  PRIMARY CARE PROVIDER: Laurey Morale, MD  RESPONSIBLE PARTY:  Acct ID - Guarantor Home Phone Work Phone Relationship Acct Type  [id number] [name], [name] Self P/F     [address] [place], [place] [address]   Due to the COVID-19 crisis, this virtual check-in visit was done via telephone from my office and it was initiated and consent given by this patient and or family.  PLAN OF CARE and INTERVENTIONS:             1. GOALS OF CARE/ ADVANCE CARE PLANNING:  Family's goal is for patient to have the best quality of life.  He is a DNR. 2. SOCIAL/EMOTIONAL/SPIRITUAL ASSESSMENT/ INTERVENTIONS:  SW conducted a Sales executive visit with patient's wife, Roy Obrien, and daughter, Roy Obrien.  Patient moved from [facility] into his daughter's home with his wife.  The facility was requiring patient to have 24 hours sitters due to behavior issues.  SW provided active listening and supportive counseling. 3. PATIENT/CAREGIVER EDUCATION/ COPING:  Patient wife and daughter express their feelings openly. 4. PERSONAL EMERGENCY PLAN:  Family will call EMS as needed. 5. COMMUNITY RESOURCES COORDINATION/ HEALTH CARE NAVIGATION:  None. 6. FINANCIAL/LEGAL CONCERNS/INTERVENTIONS:  None.     SOCIAL HX:  Social History   Tobacco Use  . Smoking status: Never Smoker  . Smokeless tobacco: Never Used  Substance Use Topics  . Alcohol use: No    Alcohol/week: 0.0 standard drinks    CODE STATUS:  DNR  ADVANCED DIRECTIVES: N MOST FORM COMPLETE:  N HOSPICE EDUCATION PROVIDED: N PPS:  Patient's appetite is normal.  He ambulates, but is unsteady at times. Duration of visit and documentation:  40 minutes.      Roy Corn Roy Peace, LCSW

## 2018-09-14 ENCOUNTER — Other Ambulatory Visit: Payer: Self-pay

## 2018-09-14 ENCOUNTER — Ambulatory Visit (INDEPENDENT_AMBULATORY_CARE_PROVIDER_SITE_OTHER): Payer: Medicare Other | Admitting: Psychiatry

## 2018-09-14 VITALS — BP 178/84 | HR 67 | Temp 97.7°F | Ht 72.0 in | Wt 203.0 lb

## 2018-09-14 DIAGNOSIS — F02818 Dementia in other diseases classified elsewhere, unspecified severity, with other behavioral disturbance: Secondary | ICD-10-CM

## 2018-09-14 DIAGNOSIS — F0281 Dementia in other diseases classified elsewhere with behavioral disturbance: Secondary | ICD-10-CM | POA: Diagnosis not present

## 2018-09-14 MED ORDER — DIVALPROEX SODIUM 250 MG PO DR TAB: DELAYED_RELEASE_TABLET | ORAL | 4 refills | Status: AC

## 2018-09-14 MED ORDER — LORAZEPAM 0.5 MG PO TABS: 1.0000 mg | ORAL_TABLET | Freq: Two times a day (BID) | ORAL | 3 refills | Status: AC

## 2018-09-14 NOTE — Progress Notes (Signed)
Psychiatric Initial Adult Assessment   Patient Identification: Roy Obrien MRN:  [id number] Date of Evaluation:  09/14/2018 Referral Source:  Chief Complaint:  Confusion Visit Diagnosis: Dementia with psychosis  History of Present Illness:  This is an 83 year old white male who was seen with his wife [name] and his daughter.  The patient is diagnosed with moderate to severe dementia.  He no longer recognizes significant family members.  He has been through a course of being at [facility] psychiatric hospital, then went to a rehab center and then went to [place] for a number of months residential care.  Eventually due to expense the family chose to bring him home.  He now lives with his wife his daughter and his son-in-law.  They are having difficulties managing him.  He is generally calm the most the time is cooperative.  He requires a lot of redirecting.  He demonstrates sundowning where he gets somewhat agitated.  He gets irritated and potentially agitated when those around him asked him to do things that he does not want to do.  There is also evidence that the patient hears voices and sees visions but does not respond to them.  The patient has no evidence of a infection.  He has no fevers or chills.  He is not coughing.  He has no shortness of breath.  He has no history of major depression or mania.  He has no history of anxiety disorder.  He has no past psychiatric history.  The patient is assisting dressing but he does feed himself.  At this time he is not incontinent of stool or urine.  He does not make attempts to leave/elope.  The family is concerned that this is coming.  He is hard to redirect but he does take all the medications that he needs to that are crushed in food.  Generally the patient is sleeping and he is eating.  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  Otherwise he is medically stable.  He shows no evidence of the coronavirus.  Associated  Signs/Symptoms: Depression Symptoms:  psychomotor agitation, (Hypo) Manic Symptoms:   Anxiety Symptoms:   Psychotic Symptoms:   PTSD Symptoms:   Past Psychiatric History: [facility]  Previous Psychotropic Medications: Ativan, Zyprexa, Zoloft, trazodone, Aricept Namenda  Substance Abuse History in the last 12 months:    Consequences of Substance Abuse:   Past Medical History:  Past Medical History:  Diagnosis Date  . ALLERGIC RHINITIS   . Allergy   . Anxiety   . Cataract   . Glaucoma    dr Tobe Sos, [place]  . Hyperlipidemia   . Hypertension   . Tubular adenoma of colon 02/2006  . Vertigo, benign positional     Past Surgical History:  Procedure Laterality Date  . COLONOSCOPY  03-18-06   per Dr. Fuller Plan, repeat in 5 yrs   . foot ablation Right    right foot nerve  . MOHS SURGERY Right 09/03/2006   basal cell carcinoma ear lobe - dr Izora Ribas    Family Psychiatric History:   Family History:  Family History  Problem Relation Age of Onset  . Breast cancer Mother   . Coronary artery disease Mother   . Stroke Father   . Emphysema Father     Social History:   Social History   Socioeconomic History  . Marital status: Married    Spouse name: married  . Number of children: 3  . Years of education: Not on  file  . Highest education level: Not on file  Occupational History  . 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 Financial resource strain: Not on file  . Food insecurity:    Worry: Not on file    Inability: Not on file  . Transportation needs:    Medical: Not on file    Non-medical: Not on file  Tobacco Use  . Smoking status: Never Smoker  . Smokeless tobacco: Never Used  Substance and Sexual Activity  . Alcohol use: No    Alcohol/week: 0.0 standard drinks  . Drug use: No  . Sexual activity: Not on file  Lifestyle  . Physical activity:    Days per week: Not on file    Minutes per session: Not on  file  . Stress: Not on file  Relationships  . Social connections:    Talks on phone: Not on file    Gets together: Not on file    Attends religious service: Not on file    Active member of club or organization: Not on file    Attends meetings of clubs or organizations: Not on file    Relationship status: Not on file  Other Topics Concern  . Not on file  Social History Narrative  . Not on file    Additional Social History:   Allergies:  No Known Allergies  Metabolic Disorder Labs: Lab Results  Component Value Date   HGBA1C (H) 06/21/2010    5.8 (NOTE)                                                                       According to the ADA Clinical Practice Recommendations for 2011, when HbA1c is used as a screening test:   >=6.5%   Diagnostic of Diabetes Mellitus           (if abnormal result  is confirmed)  5.7-6.4%   Increased risk of developing Diabetes Mellitus  References:Diagnosis and Classification of Diabetes Mellitus,Diabetes WVPX,1062,69(SWNIO 1):S62-S69 and Standards of Medical Care in         Diabetes - 2011,Diabetes Care,2011,34  (Suppl 1):S11-S61.   MPG 120 (H) 06/21/2010   No results found for: PROLACTIN Lab Results  Component Value Date   CHOL 217 (H) 05/30/2015   TRIG 76.0 05/30/2015   HDL 61.30 05/30/2015   CHOLHDL 4 05/30/2015   VLDL 15.2 05/30/2015   LDLCALC 141 (H) 05/30/2015   LDLCALC (H) 06/21/2010    131        Total Cholesterol/HDL:CHD Risk Coronary Heart Disease Risk Table                     Men   Women  1/2 Average Risk   3.4   3.3  Average Risk       5.0   4.4  2 X Average Risk   9.6   7.1  3 X Average Risk  23.4   11.0        Use the calculated Patient Ratio above and the CHD Risk Table to determine the patient's CHD Risk.        ATP III CLASSIFICATION (LDL):  <100     mg/dL   Optimal  100-129  mg/dL   Near or Above                    Optimal  130-159  mg/dL   Borderline  160-189  mg/dL   High  >190     mg/dL   Very High    Lab Results  Component Value Date   TSH 1.13 10/09/2017    Therapeutic Level Labs: No results found for: LITHIUM No results found for: CBMZ No results found for: VALPROATE  Current Medications: Current Outpatient Medications  Medication Sig Dispense Refill  . donepezil (ARICEPT) 5 MG tablet Take by mouth.    . vitamin B-12 (CYANOCOBALAMIN) 500 MCG tablet Take 500 mcg by mouth daily.    Marland Kitchen acetaminophen (TYLENOL) 500 MG tablet Take 500 mg by mouth every 6 (six) hours as needed.    Marland Kitchen LORazepam (ATIVAN) 0.5 MG tablet TAKE 1/2   1 TABLET BY MOUTH EVERY 8 HOURS AS NEEDED FOR ANXIETY.    . memantine (NAMENDA) 10 MG tablet TAKE 1 TABLET BY MOUTH TWICE A DAY FOR MEMORY.    Marland Kitchen neomycin-bacitracin-polymyxin (NEOSPORIN) ointment Apply 1 application topically every 12 (twelve) hours. To abrasions and laceration on face until healed 15 g 0  . OLANZapine (ZYPREXA) 5 MG tablet Take 5 mg by mouth 2 (two) times daily.    . sertraline (ZOLOFT) 50 MG tablet TAKE 1 TABLET BY MOUTH EVERY DAY FOR DEPRESSION    . traZODone (DESYREL) 50 MG tablet TAKE 1 TABLET BY MOUTH EVERY DAY AT BEDTIME FOR SLEEP     Current Facility-Administered Medications  Medication Dose Route Frequency Provider Last Rate Last Dose  . 0.9 %  sodium chloride infusion  500 mL Intravenous Continuous Ladene Artist, MD        Musculoskeletal: Strength & Muscle Tone: within normal limits Gait & Station: normal Patient leans: N/A  Psychiatric Specialty Exam: ROS  Blood pressure (!) 178/84, pulse 67, temperature 97.7 F (36.5 C), height 6' (1.829 m), weight 203 lb (92.1 kg).Body mass index is 27.53 kg/m.  General Appearance: Fairly Groomed  Eye Contact:  Fair  Speech:  Normal Rate  Volume:  Normal  Mood:  Negative  Affect:  Congruent  Thought Process:  Irrelevant  Orientation:  NA  Thought Content:  Hallucinations: Auditory  Suicidal Thoughts:  No  Homicidal Thoughts:  No  Memory:  Immediate;   Poor Recent;    Poor Remote;   Poor  Judgement:  Impaired  Insight:  Lacking  Psychomotor Activity:  Decreased  Concentration: Poor  Recall:  Fund of Knowledge:Poor  Language: Poor  Akathisia:  No  Handed:  Right  AIMS (if indicated):  not done  Assets:  Communication Skills  ADL's:  Impaired  Cognition: Impaired,  Moderate  Sleep:  Good   Screenings: Mini-Mental     Office Visit from 05/25/2017 in [facility] Office Visit from 06/13/2016 in [facility]  Total Score (max 30 points )  15  16    PHQ2-9     Office Visit from 05/30/2015 in [place] at [facility] from 04/18/2014 in [place] at [place]  PHQ-2 Total Score  0  0      Assessment and Plan:  At this time this patient's diagnosis is that of moderate to severe dementia with behavioral disturbances.  He demonstrates significant psychiatric symptoms including auditory hallucinations which is not seem to impact him day-to-day.  The patient shows no features of being  depressed.  He is not crying and he is sleeping and eating well.  The patient shows classic behavioral disturbances in terms of his dementia.  At the facility he was in he was sexually inappropriate and impulsive.  At this time he is very fixated on his wife who must be with him all the time.  There is some unstated issues of him being over sexual with her.  At this time the patient will discontinue his Aricept as I do not think it is relevant at this time.  He will continue taking Namenda 10 mg twice daily.  Today we will increase his Ativan from 25 mg twice daily to a new dose of 1 mg twice daily.  He will continue taking Zyprexa 5 mg.  The patient has had no falls.  The patient takes trazodone 50 mg and sleeps actually quite well.  He has no active medical problems at this time.  We will do everything we can to help this patient stay with his family but at this time we will go ahead and get them to call and have contact  with [facility] for in-home [facility].  Her intervention will be to add Depakote to this regime and to double his Ativan.  This patient to return to see me in 7 weeks.  The patient is not suicidal.  He is functioning poorly.  He is not energy less.  His family seem confident that they wish for him to stay at home and to do everything they can to keep him at home.   Jerral Ralph, MD 5/26/20203:08 PM

## 2018-10-02 ENCOUNTER — Emergency Department (HOSPITAL_COMMUNITY): Payer: Medicare Other

## 2018-10-02 ENCOUNTER — Other Ambulatory Visit: Payer: Self-pay

## 2018-10-02 ENCOUNTER — Encounter (HOSPITAL_COMMUNITY): Payer: Self-pay | Admitting: *Deleted

## 2018-10-02 ENCOUNTER — Emergency Department (HOSPITAL_COMMUNITY)
Admission: EM | Admit: 2018-10-02 | Discharge: 2018-10-06 | Disposition: A | Discharge status: Home / Self Care | Payer: Medicare Other | Attending: Emergency Medicine | Admitting: Emergency Medicine

## 2018-10-02 DIAGNOSIS — Z9181 History of falling: Secondary | ICD-10-CM | POA: Insufficient documentation

## 2018-10-02 DIAGNOSIS — R413 Other amnesia: Secondary | ICD-10-CM | POA: Diagnosis not present

## 2018-10-02 DIAGNOSIS — N400 Enlarged prostate without lower urinary tract symptoms: Secondary | ICD-10-CM | POA: Insufficient documentation

## 2018-10-02 DIAGNOSIS — F0151 Vascular dementia with behavioral disturbance: Secondary | ICD-10-CM | POA: Diagnosis not present

## 2018-10-02 DIAGNOSIS — R42 Dizziness and giddiness: Secondary | ICD-10-CM | POA: Diagnosis not present

## 2018-10-02 DIAGNOSIS — R4020 Unspecified coma: Secondary | ICD-10-CM | POA: Diagnosis not present

## 2018-10-02 DIAGNOSIS — G4733 Obstructive sleep apnea (adult) (pediatric): Secondary | ICD-10-CM | POA: Insufficient documentation

## 2018-10-02 DIAGNOSIS — Z03818 Encounter for observation for suspected exposure to other biological agents ruled out: Secondary | ICD-10-CM | POA: Insufficient documentation

## 2018-10-02 DIAGNOSIS — G309 Alzheimer's disease, unspecified: Secondary | ICD-10-CM | POA: Insufficient documentation

## 2018-10-02 DIAGNOSIS — I10 Essential (primary) hypertension: Secondary | ICD-10-CM | POA: Diagnosis not present

## 2018-10-02 DIAGNOSIS — R410 Disorientation, unspecified: Secondary | ICD-10-CM | POA: Insufficient documentation

## 2018-10-02 DIAGNOSIS — R0902 Hypoxemia: Secondary | ICD-10-CM | POA: Diagnosis not present

## 2018-10-02 DIAGNOSIS — R4182 Altered mental status, unspecified: Secondary | ICD-10-CM | POA: Diagnosis not present

## 2018-10-02 DIAGNOSIS — Z85828 Personal history of other malignant neoplasm of skin: Secondary | ICD-10-CM | POA: Diagnosis not present

## 2018-10-02 DIAGNOSIS — E782 Mixed hyperlipidemia: Secondary | ICD-10-CM | POA: Insufficient documentation

## 2018-10-02 DIAGNOSIS — Z209 Contact with and (suspected) exposure to unspecified communicable disease: Secondary | ICD-10-CM | POA: Diagnosis not present

## 2018-10-02 DIAGNOSIS — Z79899 Other long term (current) drug therapy: Secondary | ICD-10-CM | POA: Diagnosis not present

## 2018-10-02 DIAGNOSIS — R4689 Other symptoms and signs involving appearance and behavior: Secondary | ICD-10-CM | POA: Diagnosis not present

## 2018-10-02 DIAGNOSIS — R509 Fever, unspecified: Secondary | ICD-10-CM | POA: Insufficient documentation

## 2018-10-02 DIAGNOSIS — R451 Restlessness and agitation: Secondary | ICD-10-CM | POA: Diagnosis not present

## 2018-10-02 DIAGNOSIS — R4189 Other symptoms and signs involving cognitive functions and awareness: Secondary | ICD-10-CM | POA: Diagnosis not present

## 2018-10-02 DIAGNOSIS — R404 Transient alteration of awareness: Secondary | ICD-10-CM | POA: Diagnosis not present

## 2018-10-02 LAB — CBC WITH DIFFERENTIAL/PLATELET
Abs Immature Granulocytes: 0.02 10*3/uL (ref 0.00–0.07)
Basophils Absolute: 0.1 10*3/uL (ref 0.0–0.1)
Basophils Relative: 1 %
Eosinophils Absolute: 0.5 10*3/uL (ref 0.0–0.5)
Eosinophils Relative: 9 %
HCT: 43.8 % (ref 39.0–52.0)
Hemoglobin: 14 g/dL (ref 13.0–17.0)
Immature Granulocytes: 0 %
Lymphocytes Relative: 26 %
Lymphs Abs: 1.5 10*3/uL (ref 0.7–4.0)
MCH: 31.5 pg (ref 26.0–34.0)
MCHC: 32 g/dL (ref 30.0–36.0)
MCV: 98.6 fL (ref 80.0–100.0)
Monocytes Absolute: 0.7 10*3/uL (ref 0.1–1.0)
Monocytes Relative: 12 %
Neutro Abs: 3 10*3/uL (ref 1.7–7.7)
Neutrophils Relative %: 52 %
Platelets: 190 10*3/uL (ref 150–400)
RBC: 4.44 MIL/uL (ref 4.22–5.81)
RDW: 13.2 % (ref 11.5–15.5)
WBC: 5.8 10*3/uL (ref 4.0–10.5)
nRBC: 0 % (ref 0.0–0.2)

## 2018-10-02 LAB — COMPREHENSIVE METABOLIC PANEL
ALT: 19 U/L (ref 0–44)
AST: 21 U/L (ref 15–41)
Albumin: 3.5 g/dL (ref 3.5–5.0)
Alkaline Phosphatase: 72 U/L (ref 38–126)
Anion gap: 13 (ref 5–15)
BUN: 25 mg/dL — ABNORMAL HIGH (ref 8–23)
CO2: 26 mmol/L (ref 22–32)
Calcium: 9.1 mg/dL (ref 8.9–10.3)
Chloride: 103 mmol/L (ref 98–111)
Creatinine, Ser: 1.67 mg/dL — ABNORMAL HIGH (ref 0.61–1.24)
GFR calc Af Amer: 44 mL/min — ABNORMAL LOW (ref >60)
GFR calc non Af Amer: 38 mL/min — ABNORMAL LOW (ref >60)
Glucose, Bld: 85 mg/dL (ref 70–99)
Potassium: 4 mmol/L (ref 3.5–5.1)
Sodium: 142 mmol/L (ref 135–145)
Total Bilirubin: 0.4 mg/dL (ref 0.3–1.2)
Total Protein: 6.6 g/dL (ref 6.5–8.1)

## 2018-10-02 LAB — URINALYSIS, ROUTINE W REFLEX MICROSCOPIC
Bilirubin Urine: NEGATIVE
Glucose, UA: NEGATIVE mg/dL
Hgb urine dipstick: NEGATIVE
Ketones, ur: NEGATIVE mg/dL
Leukocytes,Ua: NEGATIVE
Nitrite: NEGATIVE
Protein, ur: NEGATIVE mg/dL
Specific Gravity, Urine: 1.019 (ref 1.005–1.030)
pH: 6 (ref 5.0–8.0)

## 2018-10-02 LAB — AMMONIA: Ammonia: 45 umol/L — ABNORMAL HIGH (ref 9–35)

## 2018-10-02 LAB — VALPROIC ACID LEVEL: Valproic Acid Lvl: 51 ug/mL (ref 50.0–100.0)

## 2018-10-02 LAB — LACTIC ACID, PLASMA: Lactic Acid, Venous: 1.4 mmol/L (ref 0.5–1.9)

## 2018-10-02 MED ORDER — SODIUM CHLORIDE 0.9 % IV BOLUS
500.0000 mL | Freq: Once | INTRAVENOUS | Status: AC
  Administered 2018-10-02: 500 mL via INTRAVENOUS

## 2018-10-02 NOTE — ED Triage Notes (Signed)
The pt arrived by [facility] from home he has   Been more coonfused than usual  Since yesterday  More altered today  ?? Fever     unablt to get the daughter on the phone at present.  Pt alert speech garbled  He moves all extremities

## 2018-10-02 NOTE — ED Provider Notes (Addendum)
[facility] EMERGENCY DEPARTMENT Provider Note   CSN: [id number] Arrival date & time: 10/02/18  2159     History   Chief Complaint Chief Complaint  Patient presents with   Altered Mental Status    HPI Roy Obrien is a 83 y.o. male.   The history is provided by the spouse, medical records and the EMS personnel.  Altered Mental Status   LEVEL V CAVEAT:  DEMENTIA/ AMS 83 y.o. M with hx of allergies, anxiety, cataracts, HLP, HTN, vertigo, presenting to the ED for AMS and fever.  Patient is unable to provide any history.  EMS called out due to fever and confusion.  10:27 PM Spoke with patient's wife, [name]-- states he has been off for about 2 weeks but got acutely worse last night.  States he had a very rough night last night, states he was combative with his wife as well as his daughter which is unusual for him.  States he talked all night in his sleep and it seemed like he was having "flashbacks".  He eventually did fall back asleep and slept until noon which is also very unusual for him.  States even when trying to get him to eat it seemed like he just was not aware of what was going on and was very confused, more so than baseline.  He does have fairly advanced dementia but wife reports this is far from his baseline.  States he did wander upstairs today which he never does and he did stumble but fell onto the bed.  States once he landed on the bed he seemed even more out of it so they called EMS.  Wife states they did check his temperature and it was 58F, EMS re-checked and and it was 100.48F.  Wife reports they had a psychiatry appointment a few weeks ago where his Ativan was doubled and they added Depakote to his regimen to try to help with some nighttime agitation.  Wife reports initially this seemed like it was working fairly well but not any longer.    Past Medical History:  Diagnosis Date   ALLERGIC RHINITIS    Allergy    Anxiety    Cataract     Glaucoma    dr Tobe Sos, [place]   Hyperlipidemia    Hypertension    Tubular adenoma of colon 02/2006   Vertigo, benign positional     Patient Active Problem List   Diagnosis Date Noted   Osteoarthritis of both hands 03/24/2016   Memory loss 07/24/2015   Anxiety state 06/16/2012   OSA (obstructive sleep apnea) 08/05/2010   Mixed hyperlipidemia 07/22/2010   Palpitations 07/22/2010   Snoring 07/12/2010   BENIGN POSITIONAL VERTIGO 10/03/2009   ALLERGIC RHINITIS 06/26/2009   BENIGN PROSTATIC HYPERTROPHY, WITH OBSTRUCTION 06/26/2009   Eczema 06/26/2009   COLONIC POLYPS, HX OF 06/26/2009   Essential hypertension 12/09/2006    Past Surgical History:  Procedure Laterality Date   COLONOSCOPY  03-18-06   per Dr. Fuller Plan, repeat in 5 yrs    foot ablation Right    right foot nerve   MOHS SURGERY Right 09/03/2006   basal cell carcinoma ear lobe - dr Izora Ribas        Home Medications    Prior to Admission medications   Medication Sig Start Date End Date Taking? Authorizing Provider  acetaminophen (TYLENOL) 500 MG tablet Take 500 mg by mouth every 6 (six) hours as needed.    [provider]  divalproex (DEPAKOTE)  250 MG DR tablet 1 qam for 2 days then 1 bid for 3 days then 1 qam and 2  qhs for 4 days then 2 bid therafter 09/14/18   Norma Fredrickson, MD  donepezil (ARICEPT) 5 MG tablet Take by mouth. 04/12/18   [provider]  LORazepam (ATIVAN) 0.5 MG tablet Take 2 tablets (1 mg total) by mouth 2 (two) times daily. 09/14/18   Plovsky, Berneta Sages, MD  memantine (NAMENDA) 10 MG tablet TAKE 1 TABLET BY MOUTH TWICE A DAY FOR MEMORY. 08/16/18   [provider]  neomycin-bacitracin-polymyxin (NEOSPORIN) ointment Apply 1 application topically every 12 (twelve) hours. To abrasions and laceration on face until healed 05/09/18   Mesner, Corene Cornea, MD  OLANZapine (ZYPREXA) 5 MG tablet Take 5 mg by mouth 2 (two) times daily. 08/16/18   [provider]  sertraline (ZOLOFT) 50 MG tablet TAKE 1 TABLET BY MOUTH EVERY DAY FOR DEPRESSION 08/16/18   [provider]  traZODone (DESYREL) 50 MG tablet TAKE 1 TABLET BY MOUTH EVERY DAY AT BEDTIME FOR SLEEP 08/16/18   [provider]  vitamin B-12 (CYANOCOBALAMIN) 500 MCG tablet Take 500 mcg by mouth daily.    [provider]    Family History Family History  Problem Relation Age of Onset   Breast cancer Mother    Coronary artery disease Mother    Stroke Father    Emphysema Father     Social History Social History   Tobacco Use   Smoking status: Never Smoker   Smokeless tobacco: Never Used  Substance Use Topics   Alcohol use: No    Alcohol/week: 0.0 standard drinks   Drug use: No     Allergies   Patient has no known allergies.   Review of Systems Review of Systems  Unable to perform ROS: Other     Physical Exam Updated Vital Signs BP (!) 152/71    Pulse 71    Temp 98.7 F (37.1 C) (Oral)    Resp 20   Physical Exam Vitals signs and nursing note reviewed.  Constitutional:      Appearance: He is well-developed.     Comments: Not really aware of surroundings or self  HENT:     Head: Normocephalic and atraumatic.     Comments: Contusion noted to right temple, no skull depression    Mouth/Throat:     Comments: Dry mucous membranes Eyes:     Conjunctiva/sclera: Conjunctivae normal.     Pupils: Pupils are equal, round, and reactive to light.     Comments: Pupils pinpoint but reactive  Neck:     Musculoskeletal: Normal range of motion.  Cardiovascular:     Rate and Rhythm: Normal rate and regular rhythm.     Heart sounds: Normal heart sounds.  Pulmonary:     Effort: Pulmonary effort is normal.     Breath sounds: Normal breath sounds.  Abdominal:     General: Bowel sounds are normal.     Palpations: Abdomen is soft.  Musculoskeletal: Normal range of motion.     Comments: Abrasion to left lateral thigh, no bruising or deformity, no  swelling  Skin:    General: Skin is warm and dry.  Neurological:     Comments: Awake, unaware of self, surroundings, does follow simple commands but not providing any verbal responses to questions, etc.      ED Treatments / Results  Labs (all labs ordered are listed, but only abnormal results are displayed) Labs Reviewed  COMPREHENSIVE  METABOLIC PANEL - Abnormal; Notable for the following components:      Result Value   BUN 25 (*)    Creatinine, Ser 1.67 (*)    GFR calc non Af Amer 38 (*)    GFR calc Af Amer 44 (*)    All other components within normal limits  AMMONIA - Abnormal; Notable for the following components:   Ammonia 45 (*)    All other components within normal limits  CULTURE, BLOOD (ROUTINE X 2)  SARS CORONAVIRUS 2  URINE CULTURE  CULTURE, BLOOD (ROUTINE X 2)  CBC WITH DIFFERENTIAL/PLATELET  VALPROIC ACID LEVEL  URINALYSIS, ROUTINE W REFLEX MICROSCOPIC  LACTIC ACID, PLASMA    EKG    Radiology Dg Chest 2 View  Result Date: 10/02/2018 CLINICAL DATA:  Confusion.  Questionable fever.  Garbled speech. EXAM: CHEST - 2 VIEW COMPARISON:  06/20/2010 FINDINGS: Cardiac silhouette is normal in size. No mediastinal or hilar masses. No evidence of adenopathy. Lung volumes are low.  Lungs are clear. No pleural effusion or pneumothorax. Skeletal structures are grossly intact. IMPRESSION: No active cardiopulmonary disease. Electronically Signed   By: Lajean Manes M.D.   On: 10/02/2018 22:54   Ct Head Wo Contrast  Result Date: 10/02/2018 CLINICAL DATA:  Altered level of consciousness. EXAM: CT HEAD WITHOUT CONTRAST TECHNIQUE: Contiguous axial images were obtained from the base of the skull through the vertex without intravenous contrast. COMPARISON:  05/09/2018 and older studies. FINDINGS: Brain: No evidence of acute infarction, hemorrhage, hydrocephalus, extra-axial collection or mass lesion/mass effect. There is ventricular and sulcal enlargement reflecting moderate to  advanced atrophy. Vascular: No hyperdense vessel or unexpected calcification. Skull: Normal. Negative for fracture or focal lesion. Sinuses/Orbits: Globes and orbits are unremarkable. Sinuses and mastoid air cells are clear. Other: None. IMPRESSION: 1. No acute intracranial abnormalities. 2. Atrophy without change from the prior study. Electronically Signed   By: Lajean Manes M.D.   On: 10/02/2018 23:17    Procedures Procedures (including critical care time)  Medications Ordered in ED Medications  donepezil (ARICEPT) tablet 5 mg (has no administration in time range)  LORazepam (ATIVAN) tablet 1 mg (has no administration in time range)  memantine (NAMENDA) tablet 10 mg (has no administration in time range)  OLANZapine (ZYPREXA) tablet 5 mg (5 mg Oral Given 10/03/18 0332)  sertraline (ZOLOFT) tablet 50 mg (has no administration in time range)  traZODone (DESYREL) tablet 50 mg (50 mg Oral Given 10/03/18 0326)  vitamin B-12 (CYANOCOBALAMIN) tablet 500 mcg (has no administration in time range)  sodium chloride 0.9 % bolus 500 mL (0 mLs Intravenous Stopped 10/03/18 0001)     Initial Impression / Assessment and Plan / ED Course  I have reviewed the triage vital signs and the nursing notes.  Pertinent labs & imaging results that were available during my care of the patient were reviewed by me and considered in my medical decision making (see chart for details).  83 year old male here with reported altered mental status.  He has advanced dementia and is unable to provide any reliable history.  I have spoken with his wife [name] who reports he has been going downhill over the past 2 weeks but worse over the past 24 hours.  She denies any fever or recent illness.  Does report he has been combative at night recently making it very difficult for them to manage at home.  Was recently started on Depakote and Ativan dosing was increased.  Work-up pending, will discuss with her further once  resulted.  Work up  reassuring.  No significant medical findings at this time.  Will discuss options with family.  11:57 PM Spoke with patient's daugther Seth Bake (wife has gone to bed)--we have discussed the patient's work-up, no identifiable medical reason for his altered mental status.  No findings to necessitate hospital admission, she acknowledged understanding of this.  She does report that at night patient has been somewhat out of control and very difficult for them to handle, taking 3 of them to get him subdued at times.  States they do have a sitter during the day but they do not have any help at night.  I discussed with her options for possible evaluation for temporary placement to [place] facility and family would like to pursue this.    TTS has evaluated and agrees that he would likely benefit from temporary [facility] psych placement.  They have faxed referrals and are awaiting reply.  Home meds have been ordered for patient aside from his Depakote as this is a new addition to regimen in an upward taper and not able to verify what dose he should be on at this time from pharmacy.  Will awaiting Geri-psych placement.  Final Clinical Impressions(s) / ED Diagnoses   Final diagnoses:  Cognitive and behavioral changes    ED Discharge Orders    None       Larene Pickett, PA-C 10/03/18 0502    Larene Pickett, PA-C 10/03/18 0504    Drenda Freeze, MD 10/03/18 1539

## 2018-10-03 ENCOUNTER — Other Ambulatory Visit: Payer: Self-pay

## 2018-10-03 DIAGNOSIS — F0151 Vascular dementia with behavioral disturbance: Secondary | ICD-10-CM | POA: Diagnosis not present

## 2018-10-03 LAB — SARS CORONAVIRUS 2: SARS Coronavirus 2: NOT DETECTED

## 2018-10-03 MED ORDER — OLANZAPINE 5 MG PO TABS
5.0000 mg | ORAL_TABLET | Freq: Two times a day (BID) | ORAL | Status: DC
  Administered 2018-10-03 – 2018-10-06 (×8): 5 mg via ORAL
  Filled 2018-10-03 (×9): qty 1

## 2018-10-03 MED ORDER — MEMANTINE HCL 10 MG PO TABS
10.0000 mg | ORAL_TABLET | Freq: Every day | ORAL | Status: DC
  Administered 2018-10-03 – 2018-10-06 (×4): 10 mg via ORAL
  Filled 2018-10-03 (×4): qty 1

## 2018-10-03 MED ORDER — SERTRALINE HCL 50 MG PO TABS
50.0000 mg | ORAL_TABLET | Freq: Every day | ORAL | Status: DC
  Administered 2018-10-03 – 2018-10-06 (×4): 50 mg via ORAL
  Filled 2018-10-03 (×4): qty 1

## 2018-10-03 MED ORDER — LORAZEPAM 1 MG PO TABS
1.0000 mg | ORAL_TABLET | Freq: Two times a day (BID) | ORAL | Status: DC
  Administered 2018-10-04 – 2018-10-06 (×6): 1 mg via ORAL
  Filled 2018-10-03 (×6): qty 1

## 2018-10-03 MED ORDER — VITAMIN B-12 1000 MCG PO TABS
500.0000 ug | ORAL_TABLET | Freq: Every day | ORAL | Status: DC
  Administered 2018-10-04 – 2018-10-06 (×3): 500 ug via ORAL
  Filled 2018-10-03 (×3): qty 1

## 2018-10-03 MED ORDER — DONEPEZIL HCL 5 MG PO TABS
5.0000 mg | ORAL_TABLET | Freq: Every day | ORAL | Status: DC
  Administered 2018-10-04 – 2018-10-05 (×3): 5 mg via ORAL
  Filled 2018-10-03 (×4): qty 1

## 2018-10-03 MED ORDER — TRAZODONE HCL 50 MG PO TABS
50.0000 mg | ORAL_TABLET | Freq: Every day | ORAL | Status: DC
  Administered 2018-10-03 – 2018-10-05 (×4): 50 mg via ORAL
  Filled 2018-10-03 (×4): qty 1

## 2018-10-03 NOTE — ED Notes (Signed)
The pt has completed his tts  Eating some crackers at present  He wants to go home

## 2018-10-03 NOTE — ED Notes (Signed)
Pt more awake pulling all cords off

## 2018-10-03 NOTE — ED Notes (Signed)
Pt found attempting to climb out of bed, states he wants to go home. Pt redirected and placed back in bed.

## 2018-10-03 NOTE — ED Notes (Signed)
Patient asleep.

## 2018-10-03 NOTE — ED Notes (Signed)
Patient moved to recliner.

## 2018-10-03 NOTE — ED Notes (Signed)
Pt. Attempting to get out of bed to use the bathroom. This tech offered the urinal and let the pt lnow he had a diaper on. Pt. Lying back down. Did not void.

## 2018-10-03 NOTE — ED Notes (Signed)
Pt. Attempting to get out of bed stated "I have to go pee". This tech calmed the patient and explained that I would put a condom cath on him so he can pee without getting wet. Pt was okay with this and allowed this tech to place condom cath. Pt. Lying back down calmly watching tv.

## 2018-10-03 NOTE — ED Notes (Signed)
Patient denies pain and is resting comfortably.  

## 2018-10-03 NOTE — ED Notes (Signed)
Report given to RN . Pt to room 48. Pt confused, nonsensical, pt can not answer assessment questions.  Sitter at bedside.

## 2018-10-03 NOTE — ED Notes (Signed)
The pt has spoken to the family of the pt x 2

## 2018-10-03 NOTE — Progress Notes (Signed)
Patient meets criteria for inpatient treatment. No appropriate or available beds at [facility]. CSW faxed referrals to the following facilities for review:  [facility]  [facility]  [facility]   TTS will continue to seek bed placement.  Chalmers Guest. Guerry Bruin, MSW, [place] Work/Disposition Phone: [phone] Fax: [phone]

## 2018-10-03 NOTE — ED Notes (Signed)
The pt keeps attempting to get out of bed  Sitter at bedside.  Pt sleeps slightly then wakes up and atttempts to get out of the bed again

## 2018-10-03 NOTE — ED Notes (Signed)
No safety sitter avalable

## 2018-10-03 NOTE — BH Assessment (Signed)
Tele Assessment Note   Patient Name: Roy Obrien MRN: [id number] Referring Physician: Quincy Carnes, PA-C Location of Patient: [facility] ED, [facility] Location of Provider: [place] Department  ABED SCHAR is an 83 y.o. married male who presents unaccompanied to [facility] ED via EMS from home due to altered mental status and fever. Pt has a diagnosis of dementia and family reported to ED staff he has been increasingly confused. Pt is unable to participate in assessment. He was able to give his name but unable to say his birthday or who he lives with. He responds to questions with incoherent stories.   EDP spoke with patient's wife, Roy Obrien:  "states he has been off for about 2 weeks but got acutely worse last night.  States he had a very rough night last night, states he was combative with his wife as well as his daughter which is unusual for him.  States he talked all night in his sleep and it seemed like he was having "flashbacks".  He eventually did fall back asleep and slept until noon which is also very unusual for him.  States even when trying to get him to eat it seemed like he just was not aware of what was going on and was very confused, more so than baseline.  He does have fairly advanced dementia but wife reports this is far from his baseline.  States he did wander upstairs today which he never does and he did stumble but fell onto the bed.  States once he landed on the bed he seemed even more out of it so they called EMS.  Wife states they did check his temperature and it was 52F, EMS re-checked and and it was 100.19F.  Wife reports they had a psychiatry appointment a few weeks ago where his Ativan was doubled and they added Depakote to his regimen to try to help with some nighttime agitation.  Wife reports initially this seemed like it was working fairly well but not any longer.    Spoke with patient's wife Roy Obrien--we have discussed the patient's work-up, no identifiable  medical reason for his altered mental status.  No findings to necessitate hospital admission, she acknowledged understanding of this.  She does report that at night patient has been somewhat out of control and very difficult for them to handle, taking 3 of them to get him subdued at times.  States they do have a sitter during the day but they do not have any help at night.  I discussed with her options for possible evaluation for temporary placement to [place] facility and family would like to pursue this."  Diagnosis: F01.51 Major vascular neurocognitive disorder, Probable, With behavioral disturbance  Past Medical History:  Past Medical History:  Diagnosis Date  . ALLERGIC RHINITIS   . Allergy   . Anxiety   . Cataract   . Glaucoma    dr Tobe Sos, [place]  . Hyperlipidemia   . Hypertension   . Tubular adenoma of colon 02/2006  . Vertigo, benign positional     Past Surgical History:  Procedure Laterality Date  . COLONOSCOPY  03-18-06   per Dr. Fuller Plan, repeat in 5 yrs   . foot ablation Right    right foot nerve  . MOHS SURGERY Right 09/03/2006   basal cell carcinoma ear lobe - dr Izora Ribas    Family History:  Family History  Problem Relation Age of Onset  . Breast cancer Mother   . Coronary  artery disease Mother   . Stroke Father   . Emphysema Father     Social History:  reports that he has never smoked. He has never used smokeless tobacco. He reports that he does not drink alcohol or use drugs.  Additional Social History:  Alcohol / Drug Use Pain Medications: NA Prescriptions: NA Over the Counter: NA History of alcohol / drug use?: No history of alcohol / drug abuse Longest period of sobriety (when/how long): NA  CIWA: CIWA-Ar BP: 123/60 Pulse Rate: 68 COWS:    Allergies: No Known Allergies  Home Medications: (Not in a hospital admission)   OB/GYN Status:  No LMP for male patient.  General Assessment Data Location of Assessment: [facility] ED TTS  Assessment: In system Is this a Tele or Face-to-Face Assessment?: Tele Assessment Is this an Initial Assessment or a Re-assessment for this encounter?: Initial Assessment Patient Accompanied by:: N/A Language Other than English: No Living Arrangements: Other (Comment)(Wife, daughter, son-in-law) What gender do you identify as?: Male Marital status: Married Pharmacist, community name: NA Pregnancy Status: No Living Arrangements: Children, Spouse/significant other Can pt return to current living arrangement?: Yes Admission Status: Involuntary Petitioner: ED Attending Is patient capable of signing voluntary admission?: No Referral Source: Self/Family/Friend Insurance type: Medicare     Crisis Care Plan Living Arrangements: Children, Spouse/significant other Legal Guardian: Other:(Self) Name of Psychiatrist: None Name of Therapist: None  Education Status Is patient currently in school?: No Is the patient employed, unemployed or receiving disability?: Unemployed  Risk to self with the past 6 months Suicidal Ideation: No Has patient been a risk to self within the past 6 months prior to admission? : No Suicidal Intent: No Has patient had any suicidal intent within the past 6 months prior to admission? : No Is patient at risk for suicide?: No Suicidal Plan?: No Has patient had any suicidal plan within the past 6 months prior to admission? : No Access to Means: No What has been your use of drugs/alcohol within the last 12 months?: None Previous Attempts/Gestures: No How many times?: 0 Other Self Harm Risks: NA Triggers for Past Attempts: None known Intentional Self Injurious Behavior: None Family Suicide History: Unknown, Unable to assess Recent stressful life event(s): Other (Comment)(None noted) Persecutory voices/beliefs?: (Unable to assess due to AMS) Depression: (Unable to assess due to AMS) Depression Symptoms: (Unable to assess due to AMS) Substance abuse history and/or treatment for  substance abuse?: No Suicide prevention information given to non-admitted patients: Not applicable  Risk to Others within the past 6 months Homicidal Ideation: (Unable to assess due to AMS) Does patient have any lifetime risk of violence toward others beyond the six months prior to admission? : Yes (comment) Thoughts of Harm to Others: No Current Homicidal Intent: No Current Homicidal Plan: No Access to Homicidal Means: No Identified Victim: None History of harm to others?: No Assessment of Violence: On admission Violent Behavior Description: Aggressive towards family Does patient have access to weapons?: No Criminal Charges Pending?: No Does patient have a court date: No Is patient on probation?: No  Psychosis Hallucinations: (Unable to assess due to AMS) Delusions: (Unable to assess due to AMS)  Mental Status Report Appearance/Hygiene: Other (Comment)(Naked, holding gown over groin) Eye Contact: Fair Motor Activity: Unremarkable Speech: Soft Level of Consciousness: Alert Mood: Euthymic Affect: Anxious Anxiety Level: Minimal Thought Processes: Irrelevant Judgement: Impaired Orientation: Person Obsessive Compulsive Thoughts/Behaviors: Unable to Assess  Cognitive Functioning Concentration: Poor Memory: Unable to Assess Is patient IDD: No Insight: Poor Impulse  Control: Poor Appetite: (Unable to assess due to AMS) Have you had any weight changes? : (Unable to assess due to AMS) Sleep: Unable to Assess Total Hours of Sleep: (Unable to assess due to AMS) Vegetative Symptoms: Unable to Assess  ADLScreening [facility] Assessment Services) Patient's cognitive ability adequate to safely complete daily activities?: No Patient able to express need for assistance with ADLs?: No Independently performs ADLs?: No  Prior Inpatient Therapy Prior Inpatient Therapy: (Unable to assess due to AMS)  Prior Outpatient Therapy Prior Outpatient Therapy: (Unable to assess due to AMS)  ADL  Screening (condition at time of admission) Patient's cognitive ability adequate to safely complete daily activities?: No Is the patient deaf or have difficulty hearing?: No Does the patient have difficulty seeing, even when wearing glasses/contacts?: No Does the patient have difficulty concentrating, remembering, or making decisions?: Yes Patient able to express need for assistance with ADLs?: No Does the patient have difficulty dressing or bathing?: Yes Independently performs ADLs?: No Communication: Independent Dressing (OT): Independent Grooming: Independent Feeding: Independent Bathing: Needs assistance Is this a change from baseline?: Pre-admission baseline Toileting: Independent In/Out Bed: Independent Walks in Home: Independent       Abuse/Neglect Assessment (Assessment to be complete while patient is alone) Abuse/Neglect Assessment Can Be Completed: Unable to assess, patient is non-responsive or altered mental status     Advance Directives (For Healthcare) Does Patient Have a Medical Advance Directive?: Unable to assess, patient is non-responsive or altered mental status Would patient like information on creating a medical advance directive?: No - Patient declined          Disposition: Gave clinical report to Patriciaann Clan, [place] who recommended inpatient geriatric-psychiatry unit. TTS will contact facilities for placement. Notified Quincy Carnes, PA-C of recommendation.  Disposition Initial Assessment Completed for this Encounter: Yes  This service was provided via telemedicine using a 2-way, interactive audio and video technology.  Names of all persons participating in this telemedicine service and their role in this encounter. Name: Robyne Peers Role: Patient  Name: Storm Frisk, [facility] Role: TTS counselor         Orpah Greek Anson Fret, [facility], [facility], [facility] Triage Specialist [phone]  Evelena Peat 10/03/2018 2:24 AM

## 2018-10-04 DIAGNOSIS — F0151 Vascular dementia with behavioral disturbance: Secondary | ICD-10-CM | POA: Diagnosis not present

## 2018-10-04 LAB — URINE CULTURE: Culture: NO GROWTH

## 2018-10-04 MED ORDER — DIPHENHYDRAMINE HCL 25 MG PO CAPS
50.0000 mg | ORAL_CAPSULE | Freq: Once | ORAL | Status: AC
  Administered 2018-10-04: 50 mg via ORAL
  Filled 2018-10-04: qty 2

## 2018-10-04 MED ORDER — ZIPRASIDONE MESYLATE 20 MG IM SOLR
10.0000 mg | Freq: Once | INTRAMUSCULAR | Status: AC
  Administered 2018-10-05: 10 mg via INTRAMUSCULAR
  Filled 2018-10-04: qty 20

## 2018-10-04 NOTE — ED Notes (Signed)
Pt up walking around and walked out of the unit and down the hall way. Pt is not redirectable. Security called to unit. Pt to restroom. Pt did try to swing and hit the sitter. "I'm not afraid of you all" talking to the security and [employer].

## 2018-10-04 NOTE — ED Notes (Signed)
Updated pt's wife/POA [name] of pt's status and plan of care

## 2018-10-04 NOTE — BHH Counselor (Signed)
Per [facility] no beds available today.

## 2018-10-04 NOTE — ED Notes (Signed)
Patient was given a snack and drink. 

## 2018-10-04 NOTE — ED Notes (Signed)
Patient denies pain and is resting comfortably.  

## 2018-10-04 NOTE — ED Notes (Signed)
Patient had TTS done, but wasn't awake enough to Talk. Patient was very sleepy.

## 2018-10-04 NOTE — ED Notes (Signed)
Per PA may hold meds until pt wakes up

## 2018-10-04 NOTE — ED Notes (Signed)
Breakfast tray ordered 

## 2018-10-04 NOTE — ED Provider Notes (Signed)
Daily rounding on psychiatric patient.  Please see prior provider note for full H&P.   Briefly patient presented to the ED w/ AMS. Recent admission to alternative hospital w/ discharge to rehab. He has been getting agitated @ home, recently saw psychiatry, ultimately family wanted to try having home initially, but has become too much to handle. Family would like to attempt geri-psych placement again. Prior team felt his agitation and presentation was likely secondary to dementia & medically cleared him.  I have reviewed patient's imaging, labs, & vital signs.  Seen by TTS, discussed w/ Patriciaann Clan PA-C- recommended inpatient Geriatric psych placement.   Patient sleeping, resting comfortably on my assessment.  Pending placement. Home medications have been re-ordered by prior team.            Amaryllis Dyke, PA-C 10/04/18 [place], [place], DO 10/04/18 1003

## 2018-10-04 NOTE — ED Notes (Signed)
Diet was ordered for Lunch. 

## 2018-10-04 NOTE — ED Notes (Signed)
Breakfast ordered 

## 2018-10-04 NOTE — BH Assessment (Signed)
[facility] Assessment Progress Note This Probation officer spoke with patient this date to evaluate current mental health status. Patient is observed to be drowsy and slow to respond to this writer's questions. Patent does not seem to process the content of this writer's questions. This writer attempts to re-frame questions unsuccessfully. Per Bobby Rumpf NP patient continues to meet inpatient criteria.

## 2018-10-04 NOTE — ED Notes (Signed)
Awake and wondering but redirectable for sitter

## 2018-10-05 DIAGNOSIS — R4182 Altered mental status, unspecified: Secondary | ICD-10-CM

## 2018-10-05 DIAGNOSIS — R451 Restlessness and agitation: Secondary | ICD-10-CM | POA: Diagnosis not present

## 2018-10-05 DIAGNOSIS — F0151 Vascular dementia with behavioral disturbance: Secondary | ICD-10-CM | POA: Diagnosis not present

## 2018-10-05 MED ORDER — STERILE WATER FOR INJECTION IJ SOLN
INTRAMUSCULAR | Status: AC
  Administered 2018-10-05: 1.2 mL
  Filled 2018-10-05: qty 10

## 2018-10-05 NOTE — ED Notes (Signed)
Emergency PA messaged about the [facility] PA informing this RN that the pt has been cleared psychiatrically & meets no criteria for inpatient needs.

## 2018-10-05 NOTE — ED Notes (Signed)
Pt increasing in agitation. Repeats "I'm ready to go home". Pt sitting at desk again with this RN and sitter. Refuses to return to his room. Walked pt back to his room, but pt will not lay in bed. Increase risk for fall. Will consider medication for agitation.

## 2018-10-05 NOTE — ED Notes (Signed)
Breakfast tray ordered 

## 2018-10-05 NOTE — ED Notes (Signed)
Pt up and walking to restroom. Sitter with pt. Pt sat in chair next to nurses station for 10 minutes before returning to his room.

## 2018-10-05 NOTE — ED Notes (Signed)
Pt up to restroom. Redirectable at times. Sitter with pt when walking.

## 2018-10-05 NOTE — ED Notes (Signed)
Pt up walking around with sitter assistance. Redirectable at this time.

## 2018-10-05 NOTE — Consult Note (Signed)
Telepsych Consultation   Reason for Consult:  Altered mental status/agiatation  Referring Physician:  EDP  Location of Patient: [facility] [id number] Location of Provider: [facility]  Patient Identification: Roy Obrien MRN:  [id number] Principal Diagnosis: <principal problem not specified> Diagnosis:  Active Problems:   * No active hospital problems. *   Total Time spent with patient: 15 minutes  Subjective:   Roy Obrien is a 83 y.o. male patient admitted with altered mental status and agitation.  HPI:  83 year old white male requiring psychiatric consultation due to altered mental status and agitation. Patient has a diagnosis of moderate to severe dementia. He lives with his wife Knoxx Boeding and his daughter. During this evaluation patient is alert and oriented x1 (only being able to recall his full name and date of birth). He is calm and cooperative although thoughts are disorganized/confused. He denies wanting to harm himself. He denies hearing voices or seeing things. He continues to  respond to questions with unconnected stories.   I spoke with patients wife who voiced concerns that for the past 2 weeks, patient seems more confused and wanders throughout the house. She reports at times, he does become extremely agitated although he is not physically aggressive. Reports patient has been to [facility] psychiatric hospital, then went to a memory care unit at [facility] for 5 months but was eventually brought home due to financial expenses. Reports she does have difficulty managing his behaviors and he has now become a fall risk. He has no past psychiatric history.Per sitter report, he has had no agitated  behaviors thus far today. Reports he is currently seeing a psychiatrist who 3 weeks ago adjusted his medication. Reports he has an appointment with his psychiatrist scheduled for the first week in July. With his diagnosis of moderate to severe Alzheimer's  dementia, his behavioral disturbances seems to be related to this condition. He does not appear depressed or psychotic. Patients mental state is more appropriate for a memory care unit. This was discussed with patients wife.      Past Psychiatric History: [place]  Psychotropic Medications: Ativan, Zyprexa, Zoloft, trazodone, Namenda   Risk to Self: Suicidal Ideation: No Suicidal Intent: No Is patient at risk for suicide?: No Suicidal Plan?: No Access to Means: No What has been your use of drugs/alcohol within the last 12 months?: None How many times?: 0 Other Self Harm Risks: NA Triggers for Past Attempts: None known Intentional Self Injurious Behavior: None Risk to Others: Homicidal Ideation: (Unable to assess due to AMS) Thoughts of Harm to Others: No Current Homicidal Intent: No Current Homicidal Plan: No Access to Homicidal Means: No Identified Victim: None History of harm to others?: No Assessment of Violence: On admission Violent Behavior Description: Aggressive towards family Does patient have access to weapons?: No Criminal Charges Pending?: No Does patient have a court date: No Prior Inpatient Therapy: Prior Inpatient Therapy: (Unable to assess due to AMS) Prior Outpatient Therapy: Prior Outpatient Therapy: (Unable to assess due to AMS)  Past Medical History:  Past Medical History:  Diagnosis Date  . ALLERGIC RHINITIS   . Allergy   . Anxiety   . Cataract   . Glaucoma    dr Tobe Sos, [place]  . Hyperlipidemia   . Hypertension   . Tubular adenoma of colon 02/2006  . Vertigo, benign positional     Past Surgical History:  Procedure Laterality Date  . COLONOSCOPY  03-18-06   per Dr. Fuller Plan, repeat in 5 yrs   .  foot ablation Right    right foot nerve  . MOHS SURGERY Right 09/03/2006   basal cell carcinoma ear lobe - dr Izora Ribas   Family History:  Family History  Problem Relation Age of Onset  . Breast cancer Mother   .  Coronary artery disease Mother   . Stroke Father   . Emphysema Father    Family Psychiatric  History: None  Social History:  Social History   Substance and Sexual Activity  Alcohol Use No  . Alcohol/week: 0.0 standard drinks     Social History   Substance and Sexual Activity  Drug Use No    Social History   Socioeconomic History  . Marital status: Married    Spouse name: married  . Number of children: 3  . Years of education: Not on file  . Highest education level: Not on file  Occupational History  . 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 Financial resource strain: Not on file  . Food insecurity    Worry: Not on file    Inability: Not on file  . Transportation needs    Medical: Not on file    Non-medical: Not on file  Tobacco Use  . Smoking status: Never Smoker  . Smokeless tobacco: Never Used  Substance and Sexual Activity  . Alcohol use: No    Alcohol/week: 0.0 standard drinks  . Drug use: No  . Sexual activity: Not on file  Lifestyle  . Physical activity    Days per week: Not on file    Minutes per session: Not on file  . Stress: Not on file  Relationships  . Social Herbalist on phone: Not on file    Gets together: Not on file    Attends religious service: Not on file    Active member of club or organization: Not on file    Attends meetings of clubs or organizations: Not on file    Relationship status: Not on file  Other Topics Concern  . Not on file  Social History Narrative  . Not on file   Additional Social History:    Allergies:  No Known Allergies  Labs: No results found for this or any previous visit (from the past 48 hour(s)).  Medications:  Current Facility-Administered Medications  Medication Dose Route Frequency Provider Last Rate Last Dose  . 0.9 %  sodium chloride infusion  500 mL Intravenous Continuous Ladene Artist, MD      . donepezil (ARICEPT) tablet 5 mg  5 mg Oral QHS Larene Pickett, PA-C   5 mg at 10/04/18 2330  . LORazepam (ATIVAN) tablet 1 mg  1 mg Oral BID Larene Pickett, PA-C   1 mg at 10/04/18 2330  . memantine (NAMENDA) tablet 10 mg  10 mg Oral Daily Larene Pickett, PA-C   10 mg at 10/04/18 1215  . OLANZapine (ZYPREXA) tablet 5 mg  5 mg Oral BID Larene Pickett, PA-C   5 mg at 10/04/18 2330  . sertraline (ZOLOFT) tablet 50 mg  50 mg Oral Daily Larene Pickett, PA-C   50 mg at 10/04/18 1215  . traZODone (DESYREL) tablet 50 mg  50 mg Oral QHS Larene Pickett, PA-C   50 mg at 10/04/18 2330  . vitamin B-12 (CYANOCOBALAMIN) tablet 500 mcg  500 mcg Oral Daily Larene Pickett, PA-C   500 mcg at 10/04/18 1215   Current Outpatient Medications  Medication Sig Dispense Refill  . acetaminophen (TYLENOL) 500 MG tablet Take 500 mg by mouth every 6 (six) hours as needed for mild pain or moderate pain.     . cyanocobalamin 1000 MCG tablet Take 500 mcg by mouth daily.     . divalproex (DEPAKOTE) 250 MG DR tablet 1 qam for 2 days then 1 bid for 3 days then 1 qam and 2  qhs for 4 days then 2 bid therafter (Patient taking differently: Take 500 mg by mouth 2 (two) times daily. ) 120 tablet 4  . LORazepam (ATIVAN) 0.5 MG tablet Take 2 tablets (1 mg total) by mouth 2 (two) times daily. 60 tablet 3  . memantine (NAMENDA) 10 MG tablet Take 10 mg by mouth 2 (two) times daily.     Marland Kitchen OLANZapine (ZYPREXA) 5 MG tablet Take 5 mg by mouth 2 (two) times daily.    . sertraline (ZOLOFT) 50 MG tablet Take 50 mg by mouth daily.     . traZODone (DESYREL) 50 MG tablet Take 50 mg by mouth at bedtime.     Marland Kitchen neomycin-bacitracin-polymyxin (NEOSPORIN) ointment Apply 1 application topically every 12 (twelve) hours. To abrasions and laceration on face until healed (Patient not taking: Reported on 10/03/2018) 15 g 0    Musculoskeletal: Unabklel to access as evaluation via telepsych.   Psychiatric Specialty Exam: Physical Exam  Nursing note reviewed. Neurological: He is alert.    Review of Systems   Psychiatric/Behavioral: Negative for depression, hallucinations, memory loss, substance abuse and suicidal ideas. The patient is not nervous/anxious and does not have insomnia.   All other systems reviewed and are negative.   Blood pressure (!) 145/72, pulse 64, temperature 97.9 F (36.6 C), temperature source Oral, resp. rate 18, SpO2 98 %.There is no height or weight on file to calculate BMI.  General Appearance: Casual  Eye Contact:  Fair  Speech:  Clear and Coherent and Normal Rate  Volume:  Normal  Mood:  " I feel good"  Affect:  Appropriate  Thought Process:  Disorganized  Orientation:  Other:  Alert and oreinted x1  Thought Content:  Tangential  Suicidal Thoughts:  No  Homicidal Thoughts:  No  Memory:  Immediate;   Poor Recent;   Poor Remote;   Poor  Judgement:  Fair  Insight:  Fair  Psychomotor Activity:  Normal  Concentration:  Concentration: Fair and Attention Span: Fair  Recall:  Poor  Fund of Knowledge:  Fair  Language:  Fair  Akathisia:  Negative  Handed:  Right  AIMS (if indicated):     Assets:  Resilience Social Support  ADL's:  Intact  Cognition:  Impaired,  Moderate  Sleep:        Treatment Plan Summary: Daily contact with patient to assess and evaluate symptoms and progress in treatment  Disposition: No evidence of imminent risk to self or others at present.   Patient does not meet criteria for psychiatric inpatient admission. Reccommended to continue follow-up with psychiatrist.. Recommended continued search for memory  care unit as this seem more appropriate. CSW will provide resources for memory care unit.     This service was provided via telemedicine using a 2-way, interactive audio and video technology.  Names of all persons participating in this telemedicine service and their role in this encounter. Name: Mordecai Maes  Role: FNP-C  Name: Roy Obrien Role: Patient   Name: Roy Obrien  Role: Wife    Mordecai Maes, NP 10/05/2018  9:45 AM

## 2018-10-05 NOTE — ED Notes (Addendum)
Pt out to desk and sitting next to this RN. Unable to understand what pt is talking about. Pt is pleasant but increasingly agitated. States "I want to go home". Pt ripped off ID band. Pt continues to play with ID band. Another ID bracelet made for pt and placed on wrist.

## 2018-10-05 NOTE — ED Notes (Signed)
Social Worker was contacted on regards to the pt's disposition for possible D/C home when the family needs assistance in regards to his care. EDP aware & informed.

## 2018-10-05 NOTE — ED Notes (Signed)
Diet has been ordered for Lunch. 

## 2018-10-05 NOTE — Progress Notes (Signed)
CSW met with patient's RN Paige in purple pod to discuss his current needs. RN reports that the patient was cleared from psych and that memory care has been recommended for the patient instead of psych placement.   CSW spoke with the patient's wife Sandra to obtain further information about his needs. Sandra reports that her husband has dementia and that he has been to memory care facilities in the past but the financial burden was too much. Sandra reports that [facility] was requiring that she hire a private sitter for the patient due his wandering behaviors that would cost an additional $3,000 per month in addition to the $5,000 for residential treatment. Sandra reports that the patient has Medicare A & B. Sandra states that the patient has a psychiatry appointment scheduled for early July. Sandra states that her husband does not know who she is anymore and that he knows she cares about him but does not know that they are married. Sandra reports that next week is the couple's 59th wedding anniversary. Sandra reports that she and her husband have been living with their daughter [name] Dawiggins ([phone]) for support. CSW explained the barriers to securing memory care placement and Sandra states she simply cannot afford to place her husband in a memory care facility again. CS offered the possibility of home health services for the patient until further placement can be obtained and Sandra is agreeable to home health services if they are covered by the patient's insurance.  CSW attempted to reach patient's daughter [name] at [phone] but no answer and no voicemail option available.   CSW will leave handoff for second shift coverage to continue following and assist with discharge.   , MSW, LCSW-A Clinical Social Worker [facility] [phone]  

## 2018-10-05 NOTE — ED Notes (Addendum)
Pt back to his room. Will not get into bed. States "I'm ready to go home". Sitting in chair next to bed. Sitter at bedside with pt.

## 2018-10-05 NOTE — ED Notes (Signed)
Pt up to restroom and continues to walk around with assistance. Pt  States "I want to go home". Pt does not want to return to bed. Pt was convinced to return to his room but will not get into bed. Will continue to monitor.

## 2018-10-05 NOTE — Progress Notes (Signed)
CSW received handoff from 1st shift ED CSW. CSW spoke with family about different follow-up plans in the community and discuss discharge options. CSW noted family were open but expressed concern with home health with patient's wandering behavior. CSW notes patient is ineligible for medicare to cover SNF but may be eligible for [facility] waiver. CSW notes [facility] unavailable at this point in the evening and noted patient has no PT eval. CSW discussed the option of going with a waiver and discussing with facility social worker long term medicaid application assistance to work towards possible long term acute care.   CSW informed family that patient will need to discharge and cannot remain in the ED due to infection control protocols. CSW noted family was in aggreement to that if PT did not recommend SNF and would follow up with home health. CSW informed attending who stated he will place the order in for PT eval but stated they will likely not see patient tonight. Continuing to follow for discharge supports.  Lamonte Richer, LCSW, [place] Worker II [phone]

## 2018-10-05 NOTE — ED Notes (Signed)
Pt in bed with blankets on. Sitter at bedside. Will continue to monitor.

## 2018-10-05 NOTE — ED Notes (Signed)
TTS was placed in patient room.

## 2018-10-06 DIAGNOSIS — F0151 Vascular dementia with behavioral disturbance: Secondary | ICD-10-CM | POA: Diagnosis not present

## 2018-10-06 NOTE — ED Notes (Signed)
Meal tray delivered to pt

## 2018-10-06 NOTE — TOC Transition Note (Signed)
Transition of Care [facility]) - CM/SW Discharge Note   Patient Details  Name: Roy Obrien MRN: [id number] Date of Birth: Oct 29, 1935  Transition of Care [facility]) CM/SW Contact:  Fuller Mandril, RN Phone Number: 10/06/2018, 12:20 PM   Clinical Narrative:     [facility] consulted regarding home health services for pt.  Final next level of care: [place] Barriers to Discharge: Barriers Resolved   Patient Goals and CMS Choice Patient states their goals for this hospitalization and ongoing recovery are:: demented CMS Medicare.gov Compare Post Acute Care list provided to:: Patient Represenative (must comment)(Sandra, wife)    Discharge Placement                       Discharge Plan and Services In-house Referral: Clinical Social Work Discharge Planning Services: CM Consult               [facility] reviewed PT evaluation that suggests pt return home with home health services.  EDCM contacted pt spouse, Roy Obrien who is in need of additional help as she has moved in with daughter and son-in-law to afford private duty care and extra hands.  Roy Obrien states she would like maximum assistance covered by insurance.  EDCM contacted Burr [facility] of [name] to refer pt for [facility] services.  Burr Medico states pt does not qualify at this time, but [name] can maximize services and determine once there is further internal assessment.  EDCM relayed information to Roy Obrien and she accepted their services.  No further EDCM needs identified at this time.        HH Arranged: RN, PT, OT, Social Work [employer] Agency: [place] Date [facility] Agency Contacted: 10/06/18 Time [place]: 1001 Representative spoke with at [place]: [place]  Social Determinants of Health ([place]) Interventions     Readmission Risk Interventions No flowsheet data found.

## 2018-10-06 NOTE — Progress Notes (Signed)
CSW spoke with Colletta Maryland, PT who recently completed her evaluation, PT states that the patient is independent when walking and that the recommendation would be for [facility] services instead of SNF placement. CSW notified RN CM Rosendo Gros to request assistance with setting up patient with [facility] services.  Madilyn Fireman, MSW, LCSW-A Clinical Social Worker [facility] [phone]

## 2018-10-06 NOTE — ED Notes (Signed)
Patient verbalizes understanding of discharge instructions. Opportunity for questioning and answers were provided. Armband removed by staff, pt discharged from ED.  

## 2018-10-06 NOTE — ED Notes (Signed)
Pt walking with PT. 

## 2018-10-06 NOTE — ED Notes (Signed)
Lunch tray ordered 

## 2018-10-06 NOTE — ED Notes (Addendum)
This RN called and left a HIPAA compliant voicemail at the daughters number.

## 2018-10-06 NOTE — ED Notes (Addendum)
Pts daughter returned phone call. States that she is currently at work and will be in the ED to pick up the pt around 5 pm this evening. Work number is [phone] Contact information added to snapshot

## 2018-10-06 NOTE — Evaluation (Signed)
Physical Therapy Evaluation Patient Details Name: Roy Obrien MRN: [id number] DOB: Jun 30, 1935 Today's Date: 10/06/2018   History of Present Illness  Patient is a 83 y/o male presenting to the ED on 10/02/2018 with AMS. CT head and labs and urinalysis and chest x-ray were negative. Past medical history of dementia, allergies, anxiety, cataracts, HLP, HTN, vertigo.    Clinical Impression  Patient presenting with the above listed diagnosis. Patient from home with wife. Patient agreeable to OOB mobility with PT/OT. Patient performing all functional mobility with Min guard - no LOB or overt instability and did not require physical assist for stability/mobility - does require cueing for sequencing and safety as well as obstacle navigation. Feel as if return home with HHPT and 24 hr supervision would be optimal with [place] services in place to ensure safety with mobility in the home environment. No further acute PT needs identified. PT to sign off.     Follow Up Recommendations Home health PT;Supervision/Assistance - 24 hour    Equipment Recommendations  None recommended by PT    Recommendations for Other Services       Precautions / Restrictions Precautions Precautions: Fall Restrictions Weight Bearing Restrictions: No      Mobility  Bed Mobility Overal bed mobility: Modified Independent                Transfers Overall transfer level: Needs assistance Equipment used: None Transfers: Sit to/from Stand;Stand Pivot Transfers Sit to Stand: Min guard Stand pivot transfers: Min guard       General transfer comment: min guard for safety - no physical assist for stability/mobility  Ambulation/Gait Ambulation/Gait assistance: Min guard Gait Distance (Feet): 200 Feet Assistive device: 1 person hand held assist Gait Pattern/deviations: Step-through pattern;Decreased stride length;Shuffle;Trunk flexed Gait velocity: decreased   General Gait Details: requiresa cueing for safety  with mobility and Estate agent    Modified Rankin (Stroke Patients Only)       Balance Overall balance assessment: Mild deficits observed, not formally tested                                           Pertinent Vitals/Pain Pain Assessment: No/denies pain    Home Living Family/patient expects to be discharged to:: Private residence Living Arrangements: Children;Spouse/significant other Available Help at Discharge: Family Type of Home: House           Additional Comments: unsure of home environment as patient is a poor historian    Prior Function           Comments: unsure - ambulatory at home per chart review     Hand Dominance        Extremity/Trunk Assessment   Upper Extremity Assessment Upper Extremity Assessment: Overall WFL for tasks assessed    Lower Extremity Assessment Lower Extremity Assessment: Overall WFL for tasks assessed    Cervical / Trunk Assessment Cervical / Trunk Assessment: Normal  Communication   Communication: (tangential speech, soft spoken)  Cognition Arousal/Alertness: Awake/alert Behavior During Therapy: WFL for tasks assessed/performed Overall Cognitive Status: History of cognitive impairments - at baseline  General Comments General comments (skin integrity, edema, etc.): pleasantly confused    Exercises     Assessment/Plan    PT Assessment [place] does not need any further PT services  PT Problem List         PT Treatment Interventions      PT Goals (Current goals can be found in the Care Plan section)  Acute Rehab PT Goals Patient Stated Goal: none stated PT Goal Formulation: All assessment and education complete, DC therapy    Frequency     Barriers to discharge        Co-evaluation PT/OT/SLP Co-Evaluation/Treatment: Yes Reason for Co-Treatment: Necessary to address  cognition/behavior during functional activity;For patient/therapist safety;To address functional/ADL transfers PT goals addressed during session: Mobility/safety with mobility;Balance         AM-PAC PT "6 Clicks" Mobility  Outcome Measure Help needed turning from your back to your side while in a flat bed without using bedrails?: A Little Help needed moving from lying on your back to sitting on the side of a flat bed without using bedrails?: A Little Help needed moving to and from a bed to a chair (including a wheelchair)?: A Little Help needed standing up from a chair using your arms (e.g., wheelchair or bedside chair)?: A Little Help needed to walk in hospital room?: A Little Help needed climbing 3-5 steps with a railing? : A Little 6 Click Score: 18    End of Session   Activity Tolerance: Patient tolerated treatment well Patient left: in chair;with call bell/phone within reach;with nursing/sitter in room Nurse Communication: Mobility status PT Visit Diagnosis: Unsteadiness on feet (R26.81)    Time: 1157-2620 PT Time Calculation (min) (ACUTE ONLY): 12 min   Charges:   PT Evaluation $PT Eval Moderate Complexity: 1 Mod           Lanney Gins, PT, DPT Supplemental Physical Therapist 10/06/18 9:46 AM Pager: [phone] Office: [phone]

## 2018-10-06 NOTE — Evaluation (Addendum)
Occupational Therapy Evaluation Patient Details Name: Roy Obrien MRN: [id number] DOB: Aug 16, 1935 Today's Date: 10/06/2018    History of Present Illness Patient is a 83 y/o male presenting to the ED on 10/02/2018 with AMS. CT head and labs and urinalysis and chest x-ray were negative. Past medical history of dementia, allergies, anxiety, cataracts, HLP, HTN, vertigo.   Clinical Impression   This 84 y/o male presents with the above. Pt with baseline cognitive impairments per chart review, overall pleasantly confused and easy to redirect during this session. Pt completing room and hallway level functional mobility without AD and close minguard-light minA throughout. Pt currently requires at least minA for UB/LB ADL - moreso due to cognitive impairments vs physical at this time. Anticipate pt is likely at his baseline regarding mobility/ADL completion and does not currently require further acute OT services. Given pt cognitive impairments recommend pt have 24hr supervision/assist at time of discharge to ensure pt's safety and reduce risk of falls. Acute OT to sign off at this time. Thank you for this referral.     Follow Up Recommendations  Supervision/Assistance - 24 hour(vs SNF)    Equipment Recommendations  None recommended by OT           Precautions / Restrictions Precautions Precautions: Fall Restrictions Weight Bearing Restrictions: No      Mobility Bed Mobility Overal bed mobility: Modified Independent                Transfers Overall transfer level: Needs assistance Equipment used: None Transfers: Sit to/from [employer] Transfers Sit to Stand: Min guard Stand pivot transfers: Min guard       General transfer comment: min guard for safety - no physical assist for stability/mobility    Balance Overall balance assessment: Mild deficits observed, not formally tested                                         ADL either performed or  assessed with clinical judgement   ADL Overall ADL's : Needs assistance/impaired Eating/Feeding: Set up;Sitting   Grooming: Minimal assistance;Standing   Upper Body Bathing: Minimal assistance;Sitting   Lower Body Bathing: Minimal assistance;Sit to/from stand   Upper Body Dressing : Minimal assistance;Sitting       Toilet Transfer: Min guard;Minimal assistance;Ambulation Toilet Transfer Details (indicate cue type and reason): simulated via transfer to recliner during session; room/hallway level mobility         Functional mobility during ADLs: Min guard;Minimal assistance General ADL Comments: pt mostly limited due to baseline cognitive impairments     Vision         Perception     Praxis      Pertinent Vitals/Pain Pain Assessment: No/denies pain     Hand Dominance     Extremity/Trunk Assessment Upper Extremity Assessment Upper Extremity Assessment: Overall WFL for tasks assessed   Lower Extremity Assessment Lower Extremity Assessment: Overall WFL for tasks assessed   Cervical / Trunk Assessment Cervical / Trunk Assessment: Normal   Communication Communication Communication: (tangential speech, soft spoken)   Cognition Arousal/Alertness: Awake/alert Behavior During Therapy: WFL for tasks assessed/performed Overall Cognitive Status: History of cognitive impairments - at baseline                                     General Comments  pleasantly confused    Exercises     Shoulder Instructions      Home Living Family/patient expects to be discharged to:: Private residence Living Arrangements: Children;Spouse/significant other Available Help at Discharge: Family Type of Home: House                           Additional Comments: unsure of home environment as patient is a poor historian      Prior Functioning/Environment          Comments: unsure - ambulatory at home per chart review        OT Problem List:  Decreased cognition;Decreased safety awareness;Decreased knowledge of use of DME or AE      OT Treatment/Interventions:      OT Goals(Current goals can be found in the care plan section) Acute Rehab OT Goals Patient Stated Goal: none stated OT Goal Formulation: Patient unable to participate in goal setting  OT Frequency:     Barriers to D/C:            Co-evaluation PT/OT/SLP Co-Evaluation/Treatment: Yes Reason for Co-Treatment: Necessary to address cognition/behavior during functional activity;For patient/therapist safety;To address functional/ADL transfers PT goals addressed during session: Mobility/safety with mobility;Balance OT goals addressed during session: ADL's and self-care      AM-PAC OT "6 Clicks" Daily Activity     Outcome Measure Help from another person eating meals?: None Help from another person taking care of personal grooming?: A Little Help from another person toileting, which includes using toliet, bedpan, or urinal?: A Little Help from another person bathing (including washing, rinsing, drying)?: A Little Help from another person to put on and taking off regular upper body clothing?: A Little Help from another person to put on and taking off regular lower body clothing?: A Little 6 Click Score: 19   End of Session Nurse Communication: Mobility status  Activity Tolerance: Patient tolerated treatment well Patient left: in chair;with call bell/phone within reach;with nursing/sitter in room(sitter present)  OT Visit Diagnosis: Other symptoms and signs involving cognitive function                Time: 6659-9357 OT Time Calculation (min): 12 min Charges:  OT General Charges $OT Visit: 1 Visit OT Evaluation $OT Eval Moderate Complexity: 1 Mod  Lou Cal, [place] Pager [phone] Office [phone]   Raymondo Band 10/06/2018, 10:13 AM

## 2018-10-06 NOTE — ED Notes (Signed)
EDP at bedside talking with patient and daughter

## 2018-10-06 NOTE — ED Notes (Signed)
Home health will evaluate pt at home per MCSW.

## 2018-10-07 DIAGNOSIS — Z85828 Personal history of other malignant neoplasm of skin: Secondary | ICD-10-CM | POA: Diagnosis not present

## 2018-10-07 DIAGNOSIS — H8110 Benign paroxysmal vertigo, unspecified ear: Secondary | ICD-10-CM | POA: Diagnosis not present

## 2018-10-07 DIAGNOSIS — S70312D Abrasion, left thigh, subsequent encounter: Secondary | ICD-10-CM | POA: Diagnosis not present

## 2018-10-07 DIAGNOSIS — Z9181 History of falling: Secondary | ICD-10-CM | POA: Diagnosis not present

## 2018-10-07 DIAGNOSIS — F411 Generalized anxiety disorder: Secondary | ICD-10-CM | POA: Diagnosis not present

## 2018-10-07 DIAGNOSIS — E782 Mixed hyperlipidemia: Secondary | ICD-10-CM | POA: Diagnosis not present

## 2018-10-07 DIAGNOSIS — S0083XD Contusion of other part of head, subsequent encounter: Secondary | ICD-10-CM | POA: Diagnosis not present

## 2018-10-07 DIAGNOSIS — I10 Essential (primary) hypertension: Secondary | ICD-10-CM | POA: Diagnosis not present

## 2018-10-07 DIAGNOSIS — Z86010 Personal history of colonic polyps: Secondary | ICD-10-CM | POA: Diagnosis not present

## 2018-10-07 DIAGNOSIS — J309 Allergic rhinitis, unspecified: Secondary | ICD-10-CM | POA: Diagnosis not present

## 2018-10-07 DIAGNOSIS — M19042 Primary osteoarthritis, left hand: Secondary | ICD-10-CM | POA: Diagnosis not present

## 2018-10-07 DIAGNOSIS — F0391 Unspecified dementia with behavioral disturbance: Secondary | ICD-10-CM | POA: Diagnosis not present

## 2018-10-07 DIAGNOSIS — H259 Unspecified age-related cataract: Secondary | ICD-10-CM | POA: Diagnosis not present

## 2018-10-07 DIAGNOSIS — H269 Unspecified cataract: Secondary | ICD-10-CM | POA: Diagnosis not present

## 2018-10-07 DIAGNOSIS — M19041 Primary osteoarthritis, right hand: Secondary | ICD-10-CM | POA: Diagnosis not present

## 2018-10-07 DIAGNOSIS — N400 Enlarged prostate without lower urinary tract symptoms: Secondary | ICD-10-CM | POA: Diagnosis not present

## 2018-10-07 DIAGNOSIS — G4733 Obstructive sleep apnea (adult) (pediatric): Secondary | ICD-10-CM | POA: Diagnosis not present

## 2018-10-07 LAB — CULTURE, BLOOD (ROUTINE X 2)
Culture: NO GROWTH
Culture: NO GROWTH
Special Requests: ADEQUATE

## 2018-10-08 ENCOUNTER — Telehealth: Payer: Self-pay | Admitting: Neurology

## 2018-10-08 DIAGNOSIS — G4733 Obstructive sleep apnea (adult) (pediatric): Secondary | ICD-10-CM | POA: Diagnosis not present

## 2018-10-08 DIAGNOSIS — F0391 Unspecified dementia with behavioral disturbance: Secondary | ICD-10-CM | POA: Diagnosis not present

## 2018-10-08 DIAGNOSIS — M19041 Primary osteoarthritis, right hand: Secondary | ICD-10-CM | POA: Diagnosis not present

## 2018-10-08 DIAGNOSIS — I10 Essential (primary) hypertension: Secondary | ICD-10-CM | POA: Diagnosis not present

## 2018-10-08 DIAGNOSIS — M19042 Primary osteoarthritis, left hand: Secondary | ICD-10-CM | POA: Diagnosis not present

## 2018-10-08 DIAGNOSIS — F411 Generalized anxiety disorder: Secondary | ICD-10-CM | POA: Diagnosis not present

## 2018-10-08 NOTE — Telephone Encounter (Signed)
Yes, please.  In-office visit.

## 2018-10-08 NOTE — Telephone Encounter (Signed)
Daughter called to request sooner appt. Preferably in office. Within last three weeks pt symptoms worsened stability, leaning over, speech slurred, difficult to stand up or walk. Pt has had numerous falls his steps are like a shuffle. [name] daughter [phone]. Please advise.

## 2018-10-12 ENCOUNTER — Telehealth: Payer: Self-pay | Admitting: Family Medicine

## 2018-10-12 DIAGNOSIS — G4733 Obstructive sleep apnea (adult) (pediatric): Secondary | ICD-10-CM | POA: Diagnosis not present

## 2018-10-12 DIAGNOSIS — I10 Essential (primary) hypertension: Secondary | ICD-10-CM | POA: Diagnosis not present

## 2018-10-12 DIAGNOSIS — F0391 Unspecified dementia with behavioral disturbance: Secondary | ICD-10-CM | POA: Diagnosis not present

## 2018-10-12 DIAGNOSIS — M19041 Primary osteoarthritis, right hand: Secondary | ICD-10-CM | POA: Diagnosis not present

## 2018-10-12 DIAGNOSIS — F411 Generalized anxiety disorder: Secondary | ICD-10-CM | POA: Diagnosis not present

## 2018-10-12 DIAGNOSIS — M19042 Primary osteoarthritis, left hand: Secondary | ICD-10-CM | POA: Diagnosis not present

## 2018-10-12 NOTE — Telephone Encounter (Signed)
° °  Don with [name] call to say there is delay in OT spouse decline do to to many appts  Will see pt [phone] per spouse req

## 2018-10-12 NOTE — Telephone Encounter (Signed)
FYI for Dr. Fry  

## 2018-10-13 ENCOUNTER — Telehealth: Payer: Self-pay | Admitting: Licensed Clinical Social Worker

## 2018-10-13 DIAGNOSIS — G4733 Obstructive sleep apnea (adult) (pediatric): Secondary | ICD-10-CM | POA: Diagnosis not present

## 2018-10-13 DIAGNOSIS — I10 Essential (primary) hypertension: Secondary | ICD-10-CM | POA: Diagnosis not present

## 2018-10-13 DIAGNOSIS — F411 Generalized anxiety disorder: Secondary | ICD-10-CM | POA: Diagnosis not present

## 2018-10-13 DIAGNOSIS — M19042 Primary osteoarthritis, left hand: Secondary | ICD-10-CM | POA: Diagnosis not present

## 2018-10-13 DIAGNOSIS — M19041 Primary osteoarthritis, right hand: Secondary | ICD-10-CM | POA: Diagnosis not present

## 2018-10-13 DIAGNOSIS — F0391 Unspecified dementia with behavioral disturbance: Secondary | ICD-10-CM | POA: Diagnosis not present

## 2018-10-13 NOTE — Telephone Encounter (Signed)
Palliative Care SW left vm for patient's daughter, [name], to schedule a visit.

## 2018-10-14 ENCOUNTER — Telehealth: Payer: Self-pay | Admitting: Family Medicine

## 2018-10-14 DIAGNOSIS — F411 Generalized anxiety disorder: Secondary | ICD-10-CM | POA: Diagnosis not present

## 2018-10-14 DIAGNOSIS — G4733 Obstructive sleep apnea (adult) (pediatric): Secondary | ICD-10-CM | POA: Diagnosis not present

## 2018-10-14 DIAGNOSIS — M19042 Primary osteoarthritis, left hand: Secondary | ICD-10-CM | POA: Diagnosis not present

## 2018-10-14 DIAGNOSIS — I10 Essential (primary) hypertension: Secondary | ICD-10-CM | POA: Diagnosis not present

## 2018-10-14 DIAGNOSIS — M19041 Primary osteoarthritis, right hand: Secondary | ICD-10-CM | POA: Diagnosis not present

## 2018-10-14 DIAGNOSIS — F0391 Unspecified dementia with behavioral disturbance: Secondary | ICD-10-CM | POA: Diagnosis not present

## 2018-10-14 NOTE — Telephone Encounter (Signed)
Caller/Agency: [facility] Number: [phone] ok to leave verbal on vm Requesting OT/PT/Skilled Nursing/Social Work/Speech Therapy: OT Frequency: 1 times a week for 2 week, adl and excerise caregiver education

## 2018-10-15 DIAGNOSIS — I10 Essential (primary) hypertension: Secondary | ICD-10-CM | POA: Diagnosis not present

## 2018-10-15 DIAGNOSIS — M19042 Primary osteoarthritis, left hand: Secondary | ICD-10-CM | POA: Diagnosis not present

## 2018-10-15 DIAGNOSIS — M19041 Primary osteoarthritis, right hand: Secondary | ICD-10-CM | POA: Diagnosis not present

## 2018-10-15 DIAGNOSIS — G4733 Obstructive sleep apnea (adult) (pediatric): Secondary | ICD-10-CM | POA: Diagnosis not present

## 2018-10-15 DIAGNOSIS — F411 Generalized anxiety disorder: Secondary | ICD-10-CM | POA: Diagnosis not present

## 2018-10-15 DIAGNOSIS — F0391 Unspecified dementia with behavioral disturbance: Secondary | ICD-10-CM | POA: Diagnosis not present

## 2018-10-15 NOTE — Telephone Encounter (Signed)
Please okay these orders  ?

## 2018-10-18 NOTE — Telephone Encounter (Signed)
I have called and left a VM for Don with [facility] health for these orders that were requested.

## 2018-10-19 DIAGNOSIS — F0391 Unspecified dementia with behavioral disturbance: Secondary | ICD-10-CM | POA: Diagnosis not present

## 2018-10-19 DIAGNOSIS — I10 Essential (primary) hypertension: Secondary | ICD-10-CM | POA: Diagnosis not present

## 2018-10-19 DIAGNOSIS — M19041 Primary osteoarthritis, right hand: Secondary | ICD-10-CM | POA: Diagnosis not present

## 2018-10-19 DIAGNOSIS — G4733 Obstructive sleep apnea (adult) (pediatric): Secondary | ICD-10-CM | POA: Diagnosis not present

## 2018-10-19 DIAGNOSIS — M19042 Primary osteoarthritis, left hand: Secondary | ICD-10-CM | POA: Diagnosis not present

## 2018-10-19 DIAGNOSIS — F411 Generalized anxiety disorder: Secondary | ICD-10-CM | POA: Diagnosis not present

## 2018-10-20 ENCOUNTER — Telehealth: Payer: Self-pay | Admitting: Family Medicine

## 2018-10-20 ENCOUNTER — Other Ambulatory Visit: Payer: Medicare Other | Admitting: Licensed Clinical Social Worker

## 2018-10-20 DIAGNOSIS — M19042 Primary osteoarthritis, left hand: Secondary | ICD-10-CM | POA: Diagnosis not present

## 2018-10-20 DIAGNOSIS — F411 Generalized anxiety disorder: Secondary | ICD-10-CM | POA: Diagnosis not present

## 2018-10-20 DIAGNOSIS — F0391 Unspecified dementia with behavioral disturbance: Secondary | ICD-10-CM | POA: Diagnosis not present

## 2018-10-20 DIAGNOSIS — I10 Essential (primary) hypertension: Secondary | ICD-10-CM | POA: Diagnosis not present

## 2018-10-20 DIAGNOSIS — M19041 Primary osteoarthritis, right hand: Secondary | ICD-10-CM | POA: Diagnosis not present

## 2018-10-20 DIAGNOSIS — G4733 Obstructive sleep apnea (adult) (pediatric): Secondary | ICD-10-CM | POA: Diagnosis not present

## 2018-10-20 DIAGNOSIS — Z515 Encounter for palliative care: Secondary | ICD-10-CM

## 2018-10-20 NOTE — Telephone Encounter (Signed)
Rob, from [place], called requesting to have a hospital bed order sent over to [facility] for [facility].  Fax# [phone] callback # [phone]

## 2018-10-20 NOTE — Telephone Encounter (Signed)
Dr. Sarajane Jews please advise on order.  thanks

## 2018-10-21 ENCOUNTER — Other Ambulatory Visit: Payer: Self-pay

## 2018-10-21 DIAGNOSIS — I10 Essential (primary) hypertension: Secondary | ICD-10-CM | POA: Diagnosis not present

## 2018-10-21 DIAGNOSIS — M19041 Primary osteoarthritis, right hand: Secondary | ICD-10-CM | POA: Diagnosis not present

## 2018-10-21 DIAGNOSIS — F411 Generalized anxiety disorder: Secondary | ICD-10-CM | POA: Diagnosis not present

## 2018-10-21 DIAGNOSIS — M19042 Primary osteoarthritis, left hand: Secondary | ICD-10-CM | POA: Diagnosis not present

## 2018-10-21 DIAGNOSIS — F0391 Unspecified dementia with behavioral disturbance: Secondary | ICD-10-CM | POA: Diagnosis not present

## 2018-10-21 DIAGNOSIS — G4733 Obstructive sleep apnea (adult) (pediatric): Secondary | ICD-10-CM | POA: Diagnosis not present

## 2018-10-21 NOTE — Telephone Encounter (Signed)
The rx is ready to fax  

## 2018-10-21 NOTE — Telephone Encounter (Signed)
Rx faxed to [facility]

## 2018-10-21 NOTE — Progress Notes (Signed)
COMMUNITY PALLIATIVE CARE SW NOTE  PATIENT NAME: Roy Obrien DOB: Oct 16, 1935 MRN: [id number]  PRIMARY CARE PROVIDER: Laurey Morale, MD  RESPONSIBLE PARTY:  Acct ID - Guarantor Home Phone Work Phone Relationship Acct Type  [id number] Roy [phone] Self P/F     [address] [place], [place] [address]   Due to the COVID-19 crisis, this virtual check-in visit was done via telephone from my office and it was initiated and consent given by this patient and or family.  PLAN OF CARE and INTERVENTIONS:             1. GOALS OF CARE/ ADVANCE CARE PLANNING:  Goal is for patient to have the best quality of life possible.  He has a DNR. 2. SOCIAL/EMOTIONAL/SPIRITUAL ASSESSMENT/ INTERVENTIONS:  SW conducted a Sales executive visit with patient's daughter, Roy Obrien.  She stated patient displayed significant decline in the past month.  His words are not longer understandable and he is more confused.  Patient has a sitter to help at night.  Roy Obrien reports that patient is more agitated.  She is currently seeking placement in a memory unit for patient.  Roy Obrien is providing RN, PT, ST and SW.  The [name] SW is assisting Roy Obrien with placement.  Roy Obrien will update Palliative Care when a decision is made. 3. PATIENT/CAREGIVER EDUCATION/ COPING:  Daughter expresses her feelings openly. 4. PERSONAL EMERGENCY PLAN:  Family will contact EMS as needed. 5. COMMUNITY RESOURCES COORDINATION/ HEALTH CARE NAVIGATION:  Patient is currently receiving home health from [place].   6. FINANCIAL/LEGAL CONCERNS/INTERVENTIONS:  None.     SOCIAL HX:  Social History   Tobacco Use  . Smoking status: Never Smoker  . Smokeless tobacco: Never Used  Substance Use Topics  . Alcohol use: No    Alcohol/week: 0.0 standard drinks    CODE STATUS:  DNR  ADVANCED DIRECTIVES: N MOST FORM COMPLETE:  N HOSPICE EDUCATION PROVIDED:  N PPS:  Patient can no longer walk without assistance. Duration of visit and  documentation:  45 minutes.      Roy Corn Essie Lagunes, LCSW

## 2018-10-23 DIAGNOSIS — J984 Other disorders of lung: Secondary | ICD-10-CM | POA: Diagnosis not present

## 2018-10-23 DIAGNOSIS — F0391 Unspecified dementia with behavioral disturbance: Secondary | ICD-10-CM | POA: Diagnosis not present

## 2018-10-23 DIAGNOSIS — G301 Alzheimer's disease with late onset: Secondary | ICD-10-CM | POA: Diagnosis not present

## 2018-10-23 DIAGNOSIS — F0281 Dementia in other diseases classified elsewhere with behavioral disturbance: Secondary | ICD-10-CM | POA: Diagnosis not present

## 2018-10-23 DIAGNOSIS — R4182 Altered mental status, unspecified: Secondary | ICD-10-CM | POA: Diagnosis not present

## 2018-10-23 DIAGNOSIS — Z03818 Encounter for observation for suspected exposure to other biological agents ruled out: Secondary | ICD-10-CM | POA: Diagnosis not present

## 2018-10-23 DIAGNOSIS — F919 Conduct disorder, unspecified: Secondary | ICD-10-CM | POA: Diagnosis not present

## 2018-10-23 DIAGNOSIS — R4689 Other symptoms and signs involving appearance and behavior: Secondary | ICD-10-CM | POA: Diagnosis not present

## 2018-10-23 DIAGNOSIS — R918 Other nonspecific abnormal finding of lung field: Secondary | ICD-10-CM | POA: Diagnosis not present

## 2018-10-26 ENCOUNTER — Encounter: Payer: Self-pay | Admitting: Family Medicine

## 2018-10-26 DIAGNOSIS — Z86010 Personal history of colonic polyps: Secondary | ICD-10-CM

## 2018-10-26 DIAGNOSIS — G301 Alzheimer's disease with late onset: Secondary | ICD-10-CM | POA: Diagnosis not present

## 2018-10-26 DIAGNOSIS — J309 Allergic rhinitis, unspecified: Secondary | ICD-10-CM | POA: Diagnosis not present

## 2018-10-26 DIAGNOSIS — S0083XD Contusion of other part of head, subsequent encounter: Secondary | ICD-10-CM

## 2018-10-26 DIAGNOSIS — Z85828 Personal history of other malignant neoplasm of skin: Secondary | ICD-10-CM

## 2018-10-26 DIAGNOSIS — S70312D Abrasion, left thigh, subsequent encounter: Secondary | ICD-10-CM

## 2018-10-26 DIAGNOSIS — F411 Generalized anxiety disorder: Secondary | ICD-10-CM | POA: Diagnosis not present

## 2018-10-26 DIAGNOSIS — N400 Enlarged prostate without lower urinary tract symptoms: Secondary | ICD-10-CM | POA: Diagnosis not present

## 2018-10-26 DIAGNOSIS — M19042 Primary osteoarthritis, left hand: Secondary | ICD-10-CM

## 2018-10-26 DIAGNOSIS — G4733 Obstructive sleep apnea (adult) (pediatric): Secondary | ICD-10-CM | POA: Diagnosis not present

## 2018-10-26 DIAGNOSIS — H259 Unspecified age-related cataract: Secondary | ICD-10-CM | POA: Diagnosis not present

## 2018-10-26 DIAGNOSIS — Z9181 History of falling: Secondary | ICD-10-CM

## 2018-10-26 DIAGNOSIS — F0391 Unspecified dementia with behavioral disturbance: Secondary | ICD-10-CM | POA: Diagnosis not present

## 2018-10-26 DIAGNOSIS — H269 Unspecified cataract: Secondary | ICD-10-CM | POA: Diagnosis not present

## 2018-10-26 DIAGNOSIS — H8110 Benign paroxysmal vertigo, unspecified ear: Secondary | ICD-10-CM

## 2018-10-26 DIAGNOSIS — F0390 Unspecified dementia without behavioral disturbance: Secondary | ICD-10-CM | POA: Insufficient documentation

## 2018-10-26 DIAGNOSIS — F0281 Dementia in other diseases classified elsewhere with behavioral disturbance: Secondary | ICD-10-CM | POA: Diagnosis not present

## 2018-10-26 DIAGNOSIS — M19041 Primary osteoarthritis, right hand: Secondary | ICD-10-CM | POA: Diagnosis not present

## 2018-10-26 DIAGNOSIS — E782 Mixed hyperlipidemia: Secondary | ICD-10-CM | POA: Diagnosis not present

## 2018-10-26 DIAGNOSIS — I10 Essential (primary) hypertension: Secondary | ICD-10-CM | POA: Diagnosis not present

## 2018-10-27 DIAGNOSIS — G301 Alzheimer's disease with late onset: Secondary | ICD-10-CM | POA: Diagnosis not present

## 2018-10-27 DIAGNOSIS — F0281 Dementia in other diseases classified elsewhere with behavioral disturbance: Secondary | ICD-10-CM | POA: Diagnosis not present

## 2018-10-28 DIAGNOSIS — R1312 Dysphagia, oropharyngeal phase: Secondary | ICD-10-CM | POA: Diagnosis not present

## 2018-10-28 DIAGNOSIS — F5105 Insomnia due to other mental disorder: Secondary | ICD-10-CM | POA: Diagnosis not present

## 2018-10-28 DIAGNOSIS — Z86718 Personal history of other venous thrombosis and embolism: Secondary | ICD-10-CM | POA: Diagnosis not present

## 2018-10-28 DIAGNOSIS — Z7189 Other specified counseling: Secondary | ICD-10-CM | POA: Diagnosis not present

## 2018-10-28 DIAGNOSIS — E538 Deficiency of other specified B group vitamins: Secondary | ICD-10-CM | POA: Diagnosis not present

## 2018-10-28 DIAGNOSIS — F99 Mental disorder, not otherwise specified: Secondary | ICD-10-CM | POA: Diagnosis not present

## 2018-10-28 DIAGNOSIS — E639 Nutritional deficiency, unspecified: Secondary | ICD-10-CM | POA: Diagnosis not present

## 2018-10-28 DIAGNOSIS — F0281 Dementia in other diseases classified elsewhere with behavioral disturbance: Secondary | ICD-10-CM | POA: Diagnosis not present

## 2018-10-28 DIAGNOSIS — G309 Alzheimer's disease, unspecified: Secondary | ICD-10-CM | POA: Diagnosis not present

## 2018-10-28 DIAGNOSIS — Z515 Encounter for palliative care: Secondary | ICD-10-CM | POA: Diagnosis not present

## 2018-10-28 DIAGNOSIS — N179 Acute kidney failure, unspecified: Secondary | ICD-10-CM | POA: Diagnosis not present

## 2018-10-28 DIAGNOSIS — L219 Seborrheic dermatitis, unspecified: Secondary | ICD-10-CM | POA: Diagnosis not present

## 2018-10-28 DIAGNOSIS — I10 Essential (primary) hypertension: Secondary | ICD-10-CM | POA: Diagnosis not present

## 2018-10-29 ENCOUNTER — Telehealth: Payer: Self-pay | Admitting: Family Medicine

## 2018-10-29 DIAGNOSIS — Z515 Encounter for palliative care: Secondary | ICD-10-CM | POA: Diagnosis not present

## 2018-10-29 DIAGNOSIS — F5105 Insomnia due to other mental disorder: Secondary | ICD-10-CM | POA: Diagnosis not present

## 2018-10-29 DIAGNOSIS — N179 Acute kidney failure, unspecified: Secondary | ICD-10-CM | POA: Diagnosis not present

## 2018-10-29 DIAGNOSIS — G309 Alzheimer's disease, unspecified: Secondary | ICD-10-CM | POA: Diagnosis not present

## 2018-10-29 DIAGNOSIS — Z86718 Personal history of other venous thrombosis and embolism: Secondary | ICD-10-CM | POA: Diagnosis not present

## 2018-10-29 DIAGNOSIS — E538 Deficiency of other specified B group vitamins: Secondary | ICD-10-CM | POA: Diagnosis not present

## 2018-10-29 DIAGNOSIS — F99 Mental disorder, not otherwise specified: Secondary | ICD-10-CM | POA: Diagnosis not present

## 2018-10-29 DIAGNOSIS — Z7189 Other specified counseling: Secondary | ICD-10-CM | POA: Diagnosis not present

## 2018-10-29 DIAGNOSIS — R1312 Dysphagia, oropharyngeal phase: Secondary | ICD-10-CM | POA: Diagnosis not present

## 2018-10-29 DIAGNOSIS — F0281 Dementia in other diseases classified elsewhere with behavioral disturbance: Secondary | ICD-10-CM | POA: Diagnosis not present

## 2018-10-29 DIAGNOSIS — E639 Nutritional deficiency, unspecified: Secondary | ICD-10-CM | POA: Diagnosis not present

## 2018-10-29 DIAGNOSIS — L219 Seborrheic dermatitis, unspecified: Secondary | ICD-10-CM | POA: Diagnosis not present

## 2018-10-29 NOTE — Telephone Encounter (Signed)
Roy Obrien, with [name], calling as FYI that patient went to ED in [place] on Friday or Saturday of last week and was likely going to be admitted into psychiatric unit for decline in cognition. Roy Obrien can be reached directly with any questions/concerns.

## 2018-10-30 DIAGNOSIS — F0281 Dementia in other diseases classified elsewhere with behavioral disturbance: Secondary | ICD-10-CM | POA: Diagnosis not present

## 2018-10-30 DIAGNOSIS — I10 Essential (primary) hypertension: Secondary | ICD-10-CM | POA: Diagnosis not present

## 2018-10-30 DIAGNOSIS — Z86718 Personal history of other venous thrombosis and embolism: Secondary | ICD-10-CM | POA: Diagnosis not present

## 2018-10-30 DIAGNOSIS — N179 Acute kidney failure, unspecified: Secondary | ICD-10-CM | POA: Diagnosis not present

## 2018-10-30 DIAGNOSIS — G309 Alzheimer's disease, unspecified: Secondary | ICD-10-CM | POA: Diagnosis not present

## 2018-10-30 DIAGNOSIS — Z8719 Personal history of other diseases of the digestive system: Secondary | ICD-10-CM | POA: Diagnosis not present

## 2018-10-31 DIAGNOSIS — F0281 Dementia in other diseases classified elsewhere with behavioral disturbance: Secondary | ICD-10-CM | POA: Diagnosis not present

## 2018-10-31 DIAGNOSIS — G309 Alzheimer's disease, unspecified: Secondary | ICD-10-CM | POA: Diagnosis not present

## 2018-11-01 DIAGNOSIS — F0391 Unspecified dementia with behavioral disturbance: Secondary | ICD-10-CM | POA: Diagnosis not present

## 2018-11-01 DIAGNOSIS — N179 Acute kidney failure, unspecified: Secondary | ICD-10-CM | POA: Diagnosis not present

## 2018-11-01 DIAGNOSIS — F0281 Dementia in other diseases classified elsewhere with behavioral disturbance: Secondary | ICD-10-CM | POA: Diagnosis not present

## 2018-11-01 DIAGNOSIS — I10 Essential (primary) hypertension: Secondary | ICD-10-CM | POA: Diagnosis not present

## 2018-11-01 DIAGNOSIS — Z8719 Personal history of other diseases of the digestive system: Secondary | ICD-10-CM | POA: Diagnosis not present

## 2018-11-01 DIAGNOSIS — Z86718 Personal history of other venous thrombosis and embolism: Secondary | ICD-10-CM | POA: Diagnosis not present

## 2018-11-01 DIAGNOSIS — G309 Alzheimer's disease, unspecified: Secondary | ICD-10-CM | POA: Diagnosis not present

## 2018-11-02 DIAGNOSIS — F0391 Unspecified dementia with behavioral disturbance: Secondary | ICD-10-CM | POA: Diagnosis not present

## 2018-11-03 DIAGNOSIS — N179 Acute kidney failure, unspecified: Secondary | ICD-10-CM | POA: Diagnosis not present

## 2018-11-03 DIAGNOSIS — R1312 Dysphagia, oropharyngeal phase: Secondary | ICD-10-CM | POA: Diagnosis not present

## 2018-11-03 DIAGNOSIS — Z86718 Personal history of other venous thrombosis and embolism: Secondary | ICD-10-CM | POA: Diagnosis not present

## 2018-11-03 DIAGNOSIS — F0281 Dementia in other diseases classified elsewhere with behavioral disturbance: Secondary | ICD-10-CM | POA: Diagnosis not present

## 2018-11-03 DIAGNOSIS — G309 Alzheimer's disease, unspecified: Secondary | ICD-10-CM | POA: Diagnosis not present

## 2018-11-03 DIAGNOSIS — Z8719 Personal history of other diseases of the digestive system: Secondary | ICD-10-CM | POA: Diagnosis not present

## 2018-11-03 DIAGNOSIS — Z515 Encounter for palliative care: Secondary | ICD-10-CM | POA: Diagnosis not present

## 2018-11-03 DIAGNOSIS — I10 Essential (primary) hypertension: Secondary | ICD-10-CM | POA: Diagnosis not present

## 2018-11-03 DIAGNOSIS — F99 Mental disorder, not otherwise specified: Secondary | ICD-10-CM | POA: Diagnosis not present

## 2018-11-03 DIAGNOSIS — E538 Deficiency of other specified B group vitamins: Secondary | ICD-10-CM | POA: Diagnosis not present

## 2018-11-03 DIAGNOSIS — Z7189 Other specified counseling: Secondary | ICD-10-CM | POA: Diagnosis not present

## 2018-11-03 DIAGNOSIS — E639 Nutritional deficiency, unspecified: Secondary | ICD-10-CM | POA: Diagnosis not present

## 2018-11-03 DIAGNOSIS — L219 Seborrheic dermatitis, unspecified: Secondary | ICD-10-CM | POA: Diagnosis not present

## 2018-11-03 DIAGNOSIS — F0391 Unspecified dementia with behavioral disturbance: Secondary | ICD-10-CM | POA: Diagnosis not present

## 2018-11-03 DIAGNOSIS — F5105 Insomnia due to other mental disorder: Secondary | ICD-10-CM | POA: Diagnosis not present

## 2018-11-04 ENCOUNTER — Ambulatory Visit (HOSPITAL_COMMUNITY): Payer: Self-pay | Admitting: Psychiatry

## 2018-11-04 DIAGNOSIS — F0391 Unspecified dementia with behavioral disturbance: Secondary | ICD-10-CM | POA: Diagnosis not present

## 2018-11-05 DIAGNOSIS — K5900 Constipation, unspecified: Secondary | ICD-10-CM | POA: Diagnosis not present

## 2018-11-05 DIAGNOSIS — F0391 Unspecified dementia with behavioral disturbance: Secondary | ICD-10-CM | POA: Diagnosis not present

## 2018-11-05 DIAGNOSIS — I10 Essential (primary) hypertension: Secondary | ICD-10-CM | POA: Diagnosis not present

## 2018-11-06 DIAGNOSIS — F0391 Unspecified dementia with behavioral disturbance: Secondary | ICD-10-CM | POA: Diagnosis not present

## 2018-11-07 DIAGNOSIS — K5900 Constipation, unspecified: Secondary | ICD-10-CM | POA: Diagnosis not present

## 2018-11-07 DIAGNOSIS — F0391 Unspecified dementia with behavioral disturbance: Secondary | ICD-10-CM | POA: Diagnosis not present

## 2018-11-07 DIAGNOSIS — I10 Essential (primary) hypertension: Secondary | ICD-10-CM | POA: Diagnosis not present

## 2018-11-08 DIAGNOSIS — F0391 Unspecified dementia with behavioral disturbance: Secondary | ICD-10-CM | POA: Diagnosis not present

## 2018-11-09 DIAGNOSIS — F0391 Unspecified dementia with behavioral disturbance: Secondary | ICD-10-CM | POA: Diagnosis not present

## 2018-11-09 DIAGNOSIS — K5900 Constipation, unspecified: Secondary | ICD-10-CM | POA: Diagnosis not present

## 2018-11-09 DIAGNOSIS — I10 Essential (primary) hypertension: Secondary | ICD-10-CM | POA: Diagnosis not present

## 2018-11-10 DIAGNOSIS — F0391 Unspecified dementia with behavioral disturbance: Secondary | ICD-10-CM | POA: Diagnosis not present

## 2018-11-11 DIAGNOSIS — Z86718 Personal history of other venous thrombosis and embolism: Secondary | ICD-10-CM | POA: Diagnosis not present

## 2018-11-11 DIAGNOSIS — R0781 Pleurodynia: Secondary | ICD-10-CM | POA: Diagnosis not present

## 2018-11-11 DIAGNOSIS — F99 Mental disorder, not otherwise specified: Secondary | ICD-10-CM | POA: Diagnosis not present

## 2018-11-11 DIAGNOSIS — R1312 Dysphagia, oropharyngeal phase: Secondary | ICD-10-CM | POA: Diagnosis not present

## 2018-11-11 DIAGNOSIS — E538 Deficiency of other specified B group vitamins: Secondary | ICD-10-CM | POA: Diagnosis not present

## 2018-11-11 DIAGNOSIS — N179 Acute kidney failure, unspecified: Secondary | ICD-10-CM | POA: Diagnosis not present

## 2018-11-11 DIAGNOSIS — Z8719 Personal history of other diseases of the digestive system: Secondary | ICD-10-CM | POA: Diagnosis not present

## 2018-11-11 DIAGNOSIS — Z043 Encounter for examination and observation following other accident: Secondary | ICD-10-CM | POA: Diagnosis not present

## 2018-11-11 DIAGNOSIS — F0391 Unspecified dementia with behavioral disturbance: Secondary | ICD-10-CM | POA: Diagnosis not present

## 2018-11-11 DIAGNOSIS — I10 Essential (primary) hypertension: Secondary | ICD-10-CM | POA: Diagnosis not present

## 2018-11-11 DIAGNOSIS — F5105 Insomnia due to other mental disorder: Secondary | ICD-10-CM | POA: Diagnosis not present

## 2018-11-12 DIAGNOSIS — N179 Acute kidney failure, unspecified: Secondary | ICD-10-CM | POA: Diagnosis not present

## 2018-11-12 DIAGNOSIS — G4700 Insomnia, unspecified: Secondary | ICD-10-CM | POA: Diagnosis not present

## 2018-11-12 DIAGNOSIS — Z741 Need for assistance with personal care: Secondary | ICD-10-CM | POA: Diagnosis not present

## 2018-11-12 DIAGNOSIS — F5105 Insomnia due to other mental disorder: Secondary | ICD-10-CM | POA: Diagnosis not present

## 2018-11-12 DIAGNOSIS — Z743 Need for continuous supervision: Secondary | ICD-10-CM | POA: Diagnosis not present

## 2018-11-12 DIAGNOSIS — R1312 Dysphagia, oropharyngeal phase: Secondary | ICD-10-CM | POA: Diagnosis not present

## 2018-11-12 DIAGNOSIS — F99 Mental disorder, not otherwise specified: Secondary | ICD-10-CM | POA: Diagnosis not present

## 2018-11-12 DIAGNOSIS — R279 Unspecified lack of coordination: Secondary | ICD-10-CM | POA: Diagnosis not present

## 2018-11-12 DIAGNOSIS — R410 Disorientation, unspecified: Secondary | ICD-10-CM | POA: Diagnosis not present

## 2018-11-12 DIAGNOSIS — R52 Pain, unspecified: Secondary | ICD-10-CM | POA: Diagnosis not present

## 2018-11-12 DIAGNOSIS — M542 Cervicalgia: Secondary | ICD-10-CM | POA: Diagnosis not present

## 2018-11-12 DIAGNOSIS — F0390 Unspecified dementia without behavioral disturbance: Secondary | ICD-10-CM | POA: Diagnosis not present

## 2018-11-12 DIAGNOSIS — F0391 Unspecified dementia with behavioral disturbance: Secondary | ICD-10-CM | POA: Diagnosis not present

## 2018-11-12 DIAGNOSIS — R262 Difficulty in walking, not elsewhere classified: Secondary | ICD-10-CM | POA: Diagnosis not present

## 2018-11-12 DIAGNOSIS — I10 Essential (primary) hypertension: Secondary | ICD-10-CM | POA: Diagnosis not present

## 2018-11-12 DIAGNOSIS — R5381 Other malaise: Secondary | ICD-10-CM | POA: Diagnosis not present

## 2018-11-12 DIAGNOSIS — R451 Restlessness and agitation: Secondary | ICD-10-CM | POA: Diagnosis not present

## 2018-11-12 DIAGNOSIS — Z86718 Personal history of other venous thrombosis and embolism: Secondary | ICD-10-CM | POA: Diagnosis not present

## 2018-11-12 DIAGNOSIS — M6281 Muscle weakness (generalized): Secondary | ICD-10-CM | POA: Diagnosis not present

## 2018-11-12 DIAGNOSIS — G9340 Encephalopathy, unspecified: Secondary | ICD-10-CM | POA: Diagnosis not present

## 2018-11-12 DIAGNOSIS — D519 Vitamin B12 deficiency anemia, unspecified: Secondary | ICD-10-CM | POA: Diagnosis not present

## 2018-11-12 DIAGNOSIS — E538 Deficiency of other specified B group vitamins: Secondary | ICD-10-CM | POA: Diagnosis not present

## 2018-11-12 DIAGNOSIS — L409 Psoriasis, unspecified: Secondary | ICD-10-CM | POA: Diagnosis not present

## 2018-11-12 DIAGNOSIS — Z8719 Personal history of other diseases of the digestive system: Secondary | ICD-10-CM | POA: Diagnosis not present

## 2018-11-15 DIAGNOSIS — R52 Pain, unspecified: Secondary | ICD-10-CM | POA: Diagnosis not present

## 2018-11-15 DIAGNOSIS — L409 Psoriasis, unspecified: Secondary | ICD-10-CM | POA: Diagnosis not present

## 2018-11-15 DIAGNOSIS — G4700 Insomnia, unspecified: Secondary | ICD-10-CM | POA: Diagnosis not present

## 2018-11-15 DIAGNOSIS — F0390 Unspecified dementia without behavioral disturbance: Secondary | ICD-10-CM | POA: Diagnosis not present

## 2018-11-17 DIAGNOSIS — M542 Cervicalgia: Secondary | ICD-10-CM | POA: Diagnosis not present

## 2018-11-22 DIAGNOSIS — R52 Pain, unspecified: Secondary | ICD-10-CM | POA: Diagnosis not present

## 2018-11-22 DIAGNOSIS — G4700 Insomnia, unspecified: Secondary | ICD-10-CM | POA: Diagnosis not present

## 2018-11-25 DIAGNOSIS — R52 Pain, unspecified: Secondary | ICD-10-CM | POA: Diagnosis not present

## 2018-11-25 DIAGNOSIS — G9340 Encephalopathy, unspecified: Secondary | ICD-10-CM | POA: Diagnosis not present

## 2018-11-25 DIAGNOSIS — F0390 Unspecified dementia without behavioral disturbance: Secondary | ICD-10-CM | POA: Diagnosis not present

## 2018-11-25 DIAGNOSIS — I10 Essential (primary) hypertension: Secondary | ICD-10-CM | POA: Diagnosis not present

## 2018-11-29 DIAGNOSIS — R52 Pain, unspecified: Secondary | ICD-10-CM | POA: Diagnosis not present

## 2018-11-29 DIAGNOSIS — F0390 Unspecified dementia without behavioral disturbance: Secondary | ICD-10-CM | POA: Diagnosis not present

## 2018-11-29 DIAGNOSIS — R410 Disorientation, unspecified: Secondary | ICD-10-CM | POA: Diagnosis not present

## 2018-11-29 DIAGNOSIS — L409 Psoriasis, unspecified: Secondary | ICD-10-CM | POA: Diagnosis not present

## 2018-12-01 ENCOUNTER — Ambulatory Visit: Payer: Medicare Other | Admitting: Neurology

## 2018-12-07 ENCOUNTER — Telehealth: Payer: Self-pay | Admitting: Licensed Clinical Social Worker

## 2018-12-07 NOTE — Telephone Encounter (Signed)
Palliative Care SW left a vm with patient's daughter, [name], to schedule a visit.

## 2018-12-08 DIAGNOSIS — R410 Disorientation, unspecified: Secondary | ICD-10-CM | POA: Diagnosis not present

## 2018-12-16 DIAGNOSIS — R52 Pain, unspecified: Secondary | ICD-10-CM | POA: Diagnosis not present

## 2018-12-16 DIAGNOSIS — I8290 Acute embolism and thrombosis of unspecified vein: Secondary | ICD-10-CM | POA: Diagnosis not present

## 2018-12-16 DIAGNOSIS — F0391 Unspecified dementia with behavioral disturbance: Secondary | ICD-10-CM | POA: Diagnosis not present

## 2018-12-20 DIAGNOSIS — F0391 Unspecified dementia with behavioral disturbance: Secondary | ICD-10-CM | POA: Diagnosis not present

## 2018-12-20 DIAGNOSIS — G4700 Insomnia, unspecified: Secondary | ICD-10-CM | POA: Diagnosis not present

## 2018-12-20 DIAGNOSIS — F39 Unspecified mood [affective] disorder: Secondary | ICD-10-CM | POA: Diagnosis not present

## 2018-12-23 DIAGNOSIS — Z03818 Encounter for observation for suspected exposure to other biological agents ruled out: Secondary | ICD-10-CM | POA: Diagnosis not present

## 2018-12-23 DIAGNOSIS — R509 Fever, unspecified: Secondary | ICD-10-CM | POA: Diagnosis not present

## 2018-12-23 DIAGNOSIS — R319 Hematuria, unspecified: Secondary | ICD-10-CM | POA: Diagnosis not present

## 2018-12-24 DIAGNOSIS — R05 Cough: Secondary | ICD-10-CM | POA: Diagnosis not present

## 2018-12-24 DIAGNOSIS — R509 Fever, unspecified: Secondary | ICD-10-CM | POA: Diagnosis not present

## 2018-12-28 DIAGNOSIS — R509 Fever, unspecified: Secondary | ICD-10-CM | POA: Diagnosis not present

## 2018-12-29 DIAGNOSIS — J984 Other disorders of lung: Secondary | ICD-10-CM | POA: Diagnosis not present

## 2018-12-30 DIAGNOSIS — F0281 Dementia in other diseases classified elsewhere with behavioral disturbance: Secondary | ICD-10-CM | POA: Diagnosis not present

## 2018-12-30 DIAGNOSIS — G301 Alzheimer's disease with late onset: Secondary | ICD-10-CM | POA: Diagnosis not present

## 2018-12-31 ENCOUNTER — Non-Acute Institutional Stay: Payer: Medicare Other | Admitting: Licensed Clinical Social Worker

## 2018-12-31 ENCOUNTER — Other Ambulatory Visit: Payer: Self-pay

## 2018-12-31 DIAGNOSIS — Z515 Encounter for palliative care: Secondary | ICD-10-CM

## 2018-12-31 DIAGNOSIS — J069 Acute upper respiratory infection, unspecified: Secondary | ICD-10-CM | POA: Diagnosis not present

## 2019-01-03 NOTE — Progress Notes (Signed)
COMMUNITY PALLIATIVE CARE SW NOTE  PATIENT NAME: Roy Obrien DOB: 06-30-1935 MRN: [id number]  PRIMARY CARE PROVIDER: Laurey Morale, MD  RESPONSIBLE PARTY:  Acct ID - Guarantor Home Phone Work Phone Relationship Acct Type  [id number] [name] Self P/F     [place], [place], [place] [address]   Due to the COVID-19 crisis, this virtual check-in visit was done via telephone from my office and it was initiated and consent given by this patient and or family.  PLAN OF CARE and INTERVENTIONS:             1. GOALS OF CARE/ ADVANCE CARE PLANNING:  Goal continues to be for patient to have the best quality of life possible.  Patient has a DNR. 2. SOCIAL/EMOTIONAL/SPIRITUAL ASSESSMENT/ INTERVENTIONS:  SW conducted a Sales executive visit with patient's wife, Roy Obrien.  SW had contacted patient's daughter, Roy Obrien, several times, but had not received a call back.  She stated patient was residing at [facility] in [place] after a stay at [facility].  She has not been able to visit with him since he moved to the facility due to COVID-19 precautions.  SW provided active listening and supportive counseling. 3. PATIENT/CAREGIVER EDUCATION/ COPING:  Family copes by expressing their feelings openly. 4. PERSONAL EMERGENCY PLAN:  Per facility protocol. 5. COMMUNITY RESOURCES COORDINATION/ HEALTH CARE NAVIGATION:  None. 6. FINANCIAL/LEGAL CONCERNS/INTERVENTIONS:  None.     SOCIAL HX:  Social History   Tobacco Use  . Smoking status: Never Smoker  . Smokeless tobacco: Never Used  Substance Use Topics  . Alcohol use: No    Alcohol/week: 0.0 standard drinks    CODE STATUS:  DNR  ADVANCED DIRECTIVES: N MOST FORM COMPLETE:  N HOSPICE EDUCATION PROVIDED: N Duration of visit and documentation:  45 minutes.      Creola Corn Asuncion Shibata, LCSW

## 2019-01-04 DIAGNOSIS — R509 Fever, unspecified: Secondary | ICD-10-CM | POA: Diagnosis not present

## 2019-01-04 DIAGNOSIS — J069 Acute upper respiratory infection, unspecified: Secondary | ICD-10-CM | POA: Diagnosis not present

## 2019-01-06 DIAGNOSIS — L409 Psoriasis, unspecified: Secondary | ICD-10-CM | POA: Diagnosis not present

## 2019-01-06 DIAGNOSIS — J069 Acute upper respiratory infection, unspecified: Secondary | ICD-10-CM | POA: Diagnosis not present

## 2019-01-06 DIAGNOSIS — F0391 Unspecified dementia with behavioral disturbance: Secondary | ICD-10-CM | POA: Diagnosis not present

## 2019-01-06 DIAGNOSIS — R52 Pain, unspecified: Secondary | ICD-10-CM | POA: Diagnosis not present

## 2019-01-31 DIAGNOSIS — R451 Restlessness and agitation: Secondary | ICD-10-CM | POA: Diagnosis not present

## 2019-01-31 DIAGNOSIS — I10 Essential (primary) hypertension: Secondary | ICD-10-CM | POA: Diagnosis not present

## 2019-01-31 DIAGNOSIS — F0391 Unspecified dementia with behavioral disturbance: Secondary | ICD-10-CM | POA: Diagnosis not present

## 2019-01-31 DIAGNOSIS — G4700 Insomnia, unspecified: Secondary | ICD-10-CM | POA: Diagnosis not present

## 2019-01-31 DIAGNOSIS — G9340 Encephalopathy, unspecified: Secondary | ICD-10-CM | POA: Diagnosis not present

## 2019-01-31 DIAGNOSIS — F39 Unspecified mood [affective] disorder: Secondary | ICD-10-CM | POA: Diagnosis not present

## 2019-02-04 ENCOUNTER — Non-Acute Institutional Stay: Payer: Medicare Other | Admitting: Licensed Clinical Social Worker

## 2019-02-04 ENCOUNTER — Non-Acute Institutional Stay: Payer: Medicare Other | Admitting: *Deleted

## 2019-02-04 ENCOUNTER — Other Ambulatory Visit: Payer: Self-pay

## 2019-02-04 VITALS — Ht 72.0 in | Wt 184.6 lb

## 2019-02-04 DIAGNOSIS — Z515 Encounter for palliative care: Secondary | ICD-10-CM

## 2019-02-07 ENCOUNTER — Other Ambulatory Visit: Payer: Self-pay

## 2019-02-07 NOTE — Progress Notes (Signed)
COMMUNITY PALLIATIVE CARE SW NOTE  PATIENT NAME: Roy Obrien DOB: 08/19/1935 MRN: [id number]  PRIMARY CARE PROVIDER: Fry, Stephen A, MD  RESPONSIBLE PARTY:  Acct ID - Guarantor Home Phone Work Phone Relationship Acct Type  [id number] - Mcglothen,MAR* [phone] Self P/F     [address], [place], [place] [address]     PLAN OF CARE and INTERVENTIONS:             1. GOALS OF CARE/ ADVANCE CARE PLANNING:  Wife's goal is for patient to have the best quality of life possible.  He is a DNR. 2. SOCIAL/EMOTIONAL/SPIRITUAL ASSESSMENT/ INTERVENTIONS:  SW and Palliative Care RN, Monishia Howard, met with patient and the unit SW, Cora.  Provided her with information about where patient previously resided.  Patient was able to follow simple commands.  His verbalizations were mumbled and difficult to understand at times.  He answered some questions.  He gave good eye contact.  Staff reported he participates in unit activities.  He is on the memory care unit at [facility], [place]. 3. PATIENT/CAREGIVER EDUCATION/ COPING:  Provided education regarding the Palliative Care/THN program. 4. PERSONAL EMERGENCY PLAN:  Per facility protocol. 5. COMMUNITY RESOURCES COORDINATION/ HEALTH CARE NAVIGATION:  None. 6. FINANCIAL/LEGAL CONCERNS/INTERVENTIONS:  None.     SOCIAL HX:  Social History   Tobacco Use  . Smoking status: Never Smoker  . Smokeless tobacco: Never Used  Substance Use Topics  . Alcohol use: No    Alcohol/week: 0.0 standard drinks    CODE STATUS:  DNR ADVANCED DIRECTIVES: N MOST FORM COMPLETE:  N HOSPICE EDUCATION PROVIDED:  N PPS:   Patient's appetite is normal.  He ambulates independently. Duration of visit and documentation:  60 minutes.       Z , LCSW 

## 2019-02-07 NOTE — Progress Notes (Signed)
COMMUNITY PALLIATIVE CARE RN NOTE  PATIENT NAME: Roy Obrien DOB: 03-07-1936 MRN: [id number]  PRIMARY CARE PROVIDER: Laurey Morale, MD  RESPONSIBLE PARTY:  Acct ID - Guarantor Home Phone Work Phone Relationship Acct Type  [id number] [name] Self P/F     [place], [place], [place] [address]   Covid-19 Pre-screening Negative  PLAN OF CARE and INTERVENTION:  1. ADVANCE CARE PLANNING/GOALS OF CARE: Goal is for patient to remain at current facility. He is a DNR. 2. PATIENT/CAREGIVER EDUCATION: Explained Palliative care services to facility staff 3. DISEASE STATUS: Joint visit made with Palliative care SW, Lynn Duffy. Met with patient on the unit. Update provided by facility staff. Patient is initially sitting up in a chair in the dining room awake and alert. His speech is becoming more difficult to understand and more unintelligible. Intermittent confusion. He reports some pain in his L arm, unsure of cause. He has PRN Tylenol and Tramadol available. He is ambulatory without use of assistive devices and must be guided and prompted with ADLs, such as bathing, dressing and feeding. He is intermittently incontinent of both bowel and bladder, mainly occurring during the night. He wears Depends. Staff reports that patient can be aggressive at times. He was cooperative during visit today. He is currently on scheduled Depakote and Zyprexa to help with behaviors. His intake is normal. No dysphagia reported. He has had a progressive weight loss over the past 3 months. He has lost 7 lbs over the past 3 months. Current wt is 184.6 lbs. He is on a regular diet. Staff reports that patient does participate in unit activities. Will continue to monitor.    HISTORY OF PRESENT ILLNESS: This is a 83 yo male who resides at [facility] facility on the [place] unit. Palliative care team continues to follow patient. Will continue to visit monthly and PRN.   CODE  STATUS: DNR ADVANCED DIRECTIVES: N MOST FORM: no PPS: 50%   PHYSICAL EXAM:   LUNGS: clear to auscultation  CARDIAC: Cor RRR EXTREMITIES: No edema SKIN: Exposed skin is dry and intact and facility reports no skin breakdown issues  NEURO: Alert and oriented to self, intermittent confusion, ambulatory   (Duration of visit and documentation 60 minutes)   Daryl Eastern, RN BSN

## 2019-02-14 DIAGNOSIS — F0391 Unspecified dementia with behavioral disturbance: Secondary | ICD-10-CM | POA: Diagnosis not present

## 2019-02-14 DIAGNOSIS — G4700 Insomnia, unspecified: Secondary | ICD-10-CM | POA: Diagnosis not present

## 2019-02-14 DIAGNOSIS — F39 Unspecified mood [affective] disorder: Secondary | ICD-10-CM | POA: Diagnosis not present

## 2019-02-14 DIAGNOSIS — R451 Restlessness and agitation: Secondary | ICD-10-CM | POA: Diagnosis not present

## 2019-02-15 DIAGNOSIS — G9340 Encephalopathy, unspecified: Secondary | ICD-10-CM | POA: Diagnosis not present

## 2019-02-15 DIAGNOSIS — R451 Restlessness and agitation: Secondary | ICD-10-CM | POA: Diagnosis not present

## 2019-02-16 DIAGNOSIS — R319 Hematuria, unspecified: Secondary | ICD-10-CM | POA: Diagnosis not present

## 2019-02-16 DIAGNOSIS — N3281 Overactive bladder: Secondary | ICD-10-CM | POA: Diagnosis not present

## 2019-02-17 DIAGNOSIS — F39 Unspecified mood [affective] disorder: Secondary | ICD-10-CM | POA: Diagnosis not present

## 2019-02-24 DIAGNOSIS — R52 Pain, unspecified: Secondary | ICD-10-CM | POA: Diagnosis not present

## 2019-02-24 DIAGNOSIS — L409 Psoriasis, unspecified: Secondary | ICD-10-CM | POA: Diagnosis not present

## 2019-02-24 DIAGNOSIS — F0391 Unspecified dementia with behavioral disturbance: Secondary | ICD-10-CM | POA: Diagnosis not present

## 2019-02-24 DIAGNOSIS — R451 Restlessness and agitation: Secondary | ICD-10-CM | POA: Diagnosis not present

## 2019-02-25 ENCOUNTER — Non-Acute Institutional Stay: Payer: Medicare Other | Admitting: Licensed Clinical Social Worker

## 2019-02-25 DIAGNOSIS — Z515 Encounter for palliative care: Secondary | ICD-10-CM

## 2019-02-28 ENCOUNTER — Telehealth: Payer: Self-pay

## 2019-02-28 ENCOUNTER — Other Ambulatory Visit: Payer: Self-pay

## 2019-02-28 NOTE — Telephone Encounter (Signed)
Received a call from [facility] with [facility] requesting Palliative Care notes. Will fax notes to SNF

## 2019-02-28 NOTE — Progress Notes (Signed)
COMMUNITY PALLIATIVE CARE SW NOTE  PATIENT NAME: Roy Obrien DOB: 1935-06-23 MRN: [id number]  PRIMARY CARE PROVIDER: Laurey Morale, MD  RESPONSIBLE PARTY:  Acct ID - Guarantor Home Phone Work Phone Relationship Acct Type  [id number] Roy [phone] Self P/F     [place], [place], [place] [address]     PLAN OF CARE and INTERVENTIONS:             1. GOALS OF CARE/ ADVANCE CARE PLANNING:  For patient to have the best qualify of life possible.  Patient is a DNR. 2. SOCIAL/EMOTIONAL/SPIRITUAL ASSESSMENT/ INTERVENTIONS:  SW met with patient on the memory care unit at [facility] in [place].  Also consulted CNA, Roy Obrien.  Patient was sitting with another resident eating lunch.  He was focused on his food and engaged minimally with SW.  He did not display any nonverbal indicators of pain.  The CNA reported patient is staying up at night wandering the halls and going into other patient's rooms.  He won't sleep during the day either.  SW confirmed with Roy Obrien, facility SW, that they received an MD order for Palliative Care services at the facility. 3. PATIENT/CAREGIVER EDUCATION/ COPING:  Patient copes by answering questions when he knows the answers definitively.   4. PERSONAL EMERGENCY PLAN:  Per facility protocol. 5. COMMUNITY RESOURCES COORDINATION/ HEALTH CARE NAVIGATION:  None. 6. FINANCIAL/LEGAL CONCERNS/INTERVENTIONS:  None.     SOCIAL HX:  Social History   Tobacco Use  . Smoking status: Never Smoker  . Smokeless tobacco: Never Used  Substance Use Topics  . Alcohol use: No    Alcohol/week: 0.0 standard drinks    CODE STATUS:  DNR  ADVANCED DIRECTIVES: N MOST FORM COMPLETE:  N HOSPICE EDUCATION PROVIDED:   N PPS:  His appetite is normal.  He independently ambulates. Duration of visit and documentation:  45 minutes.      Roy Corn Kunta Hilleary, LCSW

## 2019-03-07 DIAGNOSIS — Z79899 Other long term (current) drug therapy: Secondary | ICD-10-CM | POA: Diagnosis not present

## 2019-03-07 DIAGNOSIS — D649 Anemia, unspecified: Secondary | ICD-10-CM | POA: Diagnosis not present

## 2019-03-07 DIAGNOSIS — R569 Unspecified convulsions: Secondary | ICD-10-CM | POA: Diagnosis not present

## 2019-03-29 DIAGNOSIS — U071 COVID-19: Secondary | ICD-10-CM | POA: Diagnosis not present

## 2019-03-29 DIAGNOSIS — S0081XA Abrasion of other part of head, initial encounter: Secondary | ICD-10-CM | POA: Diagnosis not present

## 2019-03-29 DIAGNOSIS — W1839XA Other fall on same level, initial encounter: Secondary | ICD-10-CM | POA: Diagnosis not present

## 2019-03-29 DIAGNOSIS — I491 Atrial premature depolarization: Secondary | ICD-10-CM | POA: Diagnosis not present

## 2019-03-29 DIAGNOSIS — S0031XA Abrasion of nose, initial encounter: Secondary | ICD-10-CM | POA: Diagnosis not present

## 2019-03-29 DIAGNOSIS — R4182 Altered mental status, unspecified: Secondary | ICD-10-CM | POA: Diagnosis not present

## 2019-03-29 DIAGNOSIS — S022XXA Fracture of nasal bones, initial encounter for closed fracture: Secondary | ICD-10-CM | POA: Diagnosis not present

## 2019-03-29 DIAGNOSIS — Y998 Other external cause status: Secondary | ICD-10-CM | POA: Diagnosis not present

## 2019-03-29 DIAGNOSIS — S50311A Abrasion of right elbow, initial encounter: Secondary | ICD-10-CM | POA: Diagnosis not present

## 2019-03-30 DIAGNOSIS — I491 Atrial premature depolarization: Secondary | ICD-10-CM | POA: Diagnosis not present

## 2019-04-12 ENCOUNTER — Non-Acute Institutional Stay: Payer: Medicare Other | Admitting: Licensed Clinical Social Worker

## 2019-04-12 DIAGNOSIS — Z515 Encounter for palliative care: Secondary | ICD-10-CM

## 2019-04-13 ENCOUNTER — Other Ambulatory Visit: Payer: Self-pay

## 2019-04-13 NOTE — Progress Notes (Signed)
COMMUNITY PALLIATIVE CARE SW NOTE  PATIENT NAME: Roy Obrien DOB: 1935-06-28 MRN: [id number]  PRIMARY CARE PROVIDER: Laurey Morale, MD  RESPONSIBLE PARTY:  Acct ID - Guarantor Home Phone Work Phone Relationship Acct Type  [id number] [name] Self P/F     [place], [place], [place] [address]     PLAN OF CARE and INTERVENTIONS:             1. GOALS OF CARE/ ADVANCE CARE PLANNING:  Goal is for patient to have the best quality of life.  He is a DNR. 2. SOCIAL/EMOTIONAL/SPIRITUAL ASSESSMENT/ INTERVENTIONS:  SW met with patient at [facility] on the memory care unit.  Patient was sleeping in bed and appeared comfortable with no nonverbal indicators of pain.  SW consulted the facility SW, Mel Almond, who reported patient had COVID-19.  SW provided active listening and supportive counseling to the staff due to several deaths on the unit. 3. PATIENT/CAREGIVER EDUCATION/ COPING:  Family copes by problem-solving. 4. PERSONAL EMERGENCY PLAN:  Per facility protocol. 5. COMMUNITY RESOURCES COORDINATION/ HEALTH CARE NAVIGATION:  None. 6. FINANCIAL/LEGAL CONCERNS/INTERVENTIONS:  None.     SOCIAL HX:  Social History   Tobacco Use  . Smoking status: Never Smoker  . Smokeless tobacco: Never Used  Substance Use Topics  . Alcohol use: No    Alcohol/week: 0.0 standard drinks    CODE STATUS:  DNR  ADVANCED DIRECTIVES: N MOST FORM COMPLETE:  N HOSPICE EDUCATION PROVIDED:  N PPS:  Patient's appetite has decreased.  He is currently bed bound. Duration of visit and documentation:  60 minutes.                                        Creola Corn Avalon Coppinger, LCSW

## 2019-05-16 ENCOUNTER — Non-Acute Institutional Stay: Payer: Medicare Other | Admitting: Licensed Clinical Social Worker

## 2019-05-16 DIAGNOSIS — Z515 Encounter for palliative care: Secondary | ICD-10-CM

## 2019-05-17 ENCOUNTER — Other Ambulatory Visit: Payer: Self-pay

## 2019-05-18 NOTE — Progress Notes (Signed)
COMMUNITY PALLIATIVE CARE SW NOTE  PATIENT NAME: Roy Obrien DOB: [birth year] MRN: [id number]  PRIMARY CARE PROVIDER: Laurey Morale, MD  RESPONSIBLE PARTY:  Acct ID - Guarantor Home Phone Work Phone Relationship Acct Type  [id number] Roy [phone] Self P/F     [place], [place], [place] [address]   Due to the COVID-19 crisis, this virtual check-in visit was done via telephone from my office and it was initiated and consent given by this patientand orfamily.  PLAN OF CARE and INTERVENTIONS:             1. GOALS OF CARE/ ADVANCE CARE PLANNING:  Family's goal is for patient to have the best qualify of life.  Patient has a DNR. 2. SOCIAL/EMOTIONAL/SPIRITUAL ASSESSMENT/ INTERVENTIONS:  SW conducted a Sales executive visit with patient's facility nurse, Roy Obrien.  She reports patient is almost at baseline prior to his COVID diagnosis.  He ambulates around the unit and his appetite is much improved.  He had lost a significant amount of weight during COVID and is now receiving double portions. 3. PATIENT/CAREGIVER EDUCATION/ COPING:  Family copes by problem-solving. 4. PERSONAL EMERGENCY PLAN:  Per facility protocol. 5. COMMUNITY RESOURCES COORDINATION/ HEALTH CARE NAVIGATION:  None. 6. FINANCIAL/LEGAL CONCERNS/INTERVENTIONS:  None.     SOCIAL HX:  Social History   Tobacco Use  . Smoking status: Never Smoker  . Smokeless tobacco: Never Used  Substance Use Topics  . Alcohol use: No    Alcohol/week: 0.0 standard drinks    CODE STATUS:  DNR  ADVANCED DIRECTIVES: N MOST FORM COMPLETE:  N HOSPICE EDUCATION PROVIDED:  N PPS:  Patient's appetite has improved.  He ambulates with a walker. Duration of visit and documentation:  30 minutes.      Roy Corn Lashaunta Sicard, LCSW

## 2019-05-30 ENCOUNTER — Non-Acute Institutional Stay: Payer: Medicare Other | Admitting: Licensed Clinical Social Worker

## 2019-05-30 DIAGNOSIS — Z515 Encounter for palliative care: Secondary | ICD-10-CM

## 2019-05-30 NOTE — Progress Notes (Signed)
COMMUNITY PALLIATIVE CARE SW NOTE  PATIENT NAME: Roy Obrien DOB: 01-25-1936 MRN: [id number]  PRIMARY CARE PROVIDER: Laurey Morale, MD  RESPONSIBLE PARTY:  Acct ID - Guarantor Home Phone Work Phone Relationship Acct Type  [id number] [name] Self P/F     [place], [place], [place] [address]     PLAN OF CARE and INTERVENTIONS:             1. GOALS OF CARE/ ADVANCE CARE PLANNING:  Goal is for patient to have the best quality of life.  He is a DNR. 2. SOCIAL/EMOTIONAL/SPIRITUAL ASSESSMENT/ INTERVENTIONS:  SW met with patient in the dining room on the memory care unit at [facility].  Patient was sitting at one of the tables.  He displayed a flat affect and gave good eye contact.  He was nonverbal during the visit, which is a change.  The nurse, Otila Kluver, stated patient was talking earlier today and that he was probably tired.  His verbalizations are difficult to understand and mumbled at times per RN.  He appears thinner also.  Consulted unit SW, [place], who stated patient has been very anxious.  The psychiatric nurse will be at the facility tomorrow and staff will inform her of their concern. 3. PATIENT/CAREGIVER EDUCATION/ COPING:  Family copes by problem-solving. 4. PERSONAL EMERGENCY PLAN:  Per facility protocol. 5. COMMUNITY RESOURCES COORDINATION/ HEALTH CARE NAVIGATION:  None. 6. FINANCIAL/LEGAL CONCERNS/INTERVENTIONS:  None.     SOCIAL HX:  Social History   Tobacco Use  . Smoking status: Never Smoker  . Smokeless tobacco: Never Used  Substance Use Topics  . Alcohol use: No    Alcohol/week: 0.0 standard drinks    CODE STATUS:  DNR ADVANCED DIRECTIVES: N MOST FORM COMPLETE:  N HOSPICE EDUCATION PROVIDED: N PPS:  Appetite is normal.  He ambulates with a walker. Duration of visit and documentation:  45 minutes.      Creola Corn Kimley Apsey, LCSW

## 2019-05-31 ENCOUNTER — Other Ambulatory Visit: Payer: Self-pay

## 2019-07-21 DEATH — deceased

## 2020-10-09 IMAGING — CT CT CERVICAL SPINE W/O CM
4 of 10 series · 9 of 33 positions shown, 10 images · non-contrast
Comparison: Previous MRI from 02/24/2016.

CLINICAL DATA: Initial evaluation for acute trauma, fall.

EXAM:
CT HEAD WITHOUT CONTRAST
CT MAXILLOFACIAL WITHOUT CONTRAST
CT CERVICAL SPINE WITHOUT CONTRAST
TECHNIQUE: Multidetector CT imaging of the head, cervical spine, and
maxillofacial structures were performed using the standard protocol
without intravenous contrast. Multiplanar CT image reconstructions
of the cervical spine and maxillofacial structures were also
generated.

[Series 9: c spine soft · axial · 0.33mm/px · z∈[-158,-100]mm · 2 of 89 slices shown]
[im 30/89  soft-tissue]
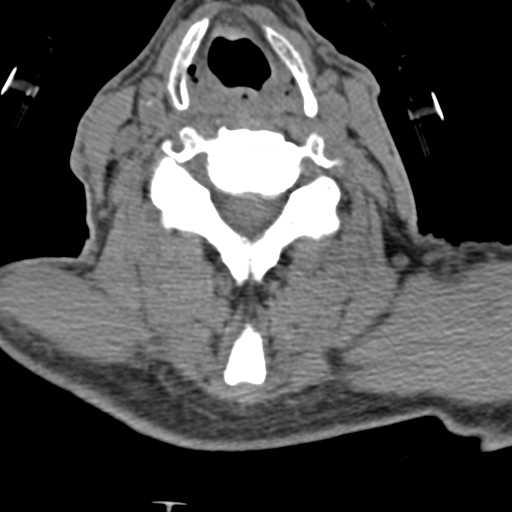
[im 59/89  soft-tissue]
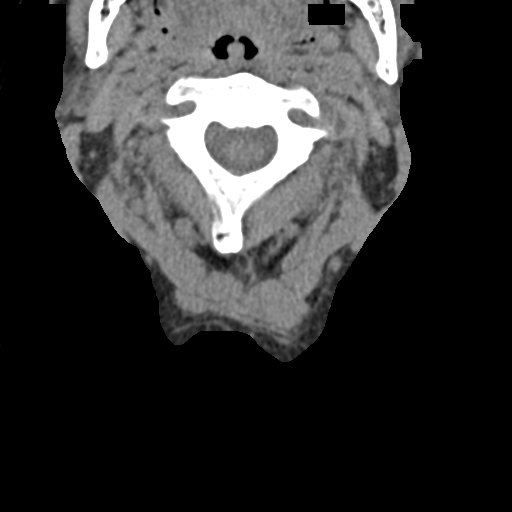

[Series 11: orthogonal axials · axial · 0.27mm/px · z∈[-213,-114]mm · 3 of 110 slices shown, 4 images]
[im 28/110  soft-tissue]
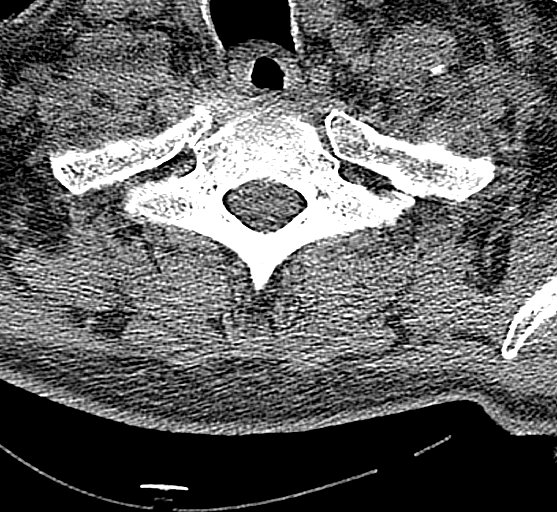
[im 28/110  bone]
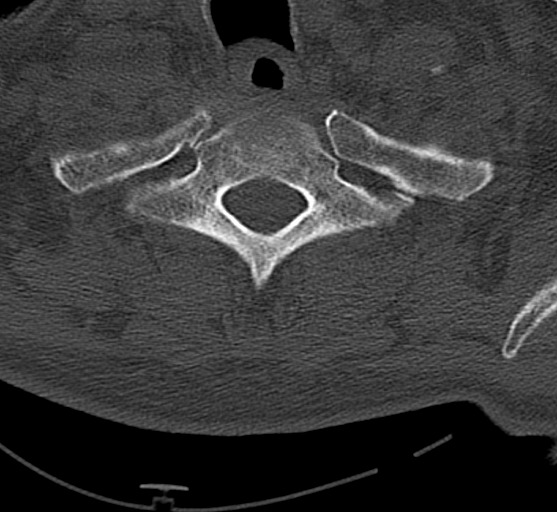
[im 55/110  bone]
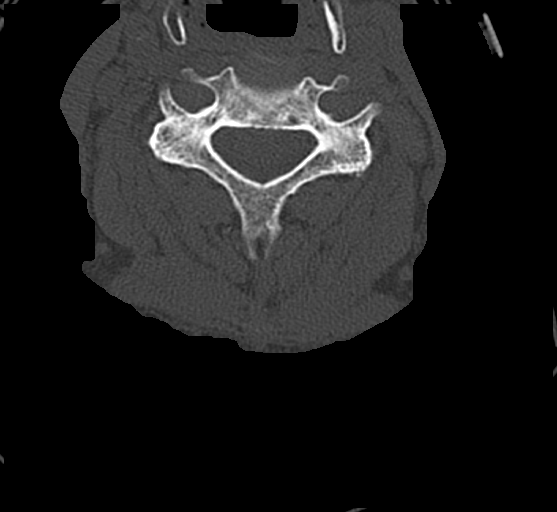
[im 82/110  bone]
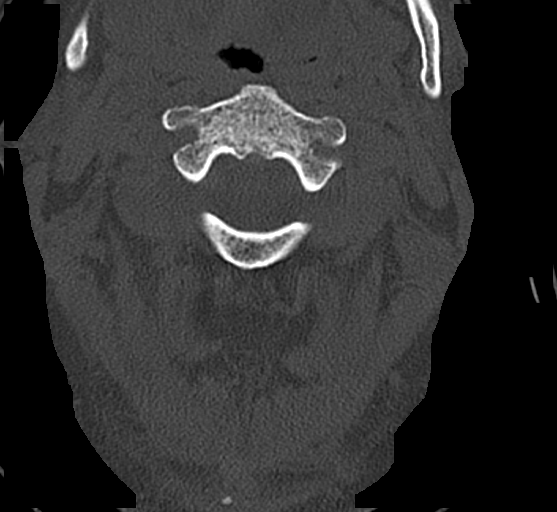

[Series 13: sagittal bone · sagittal · 0.26mm/px · 2 of 61 slices shown]
[im 21/61  bone]
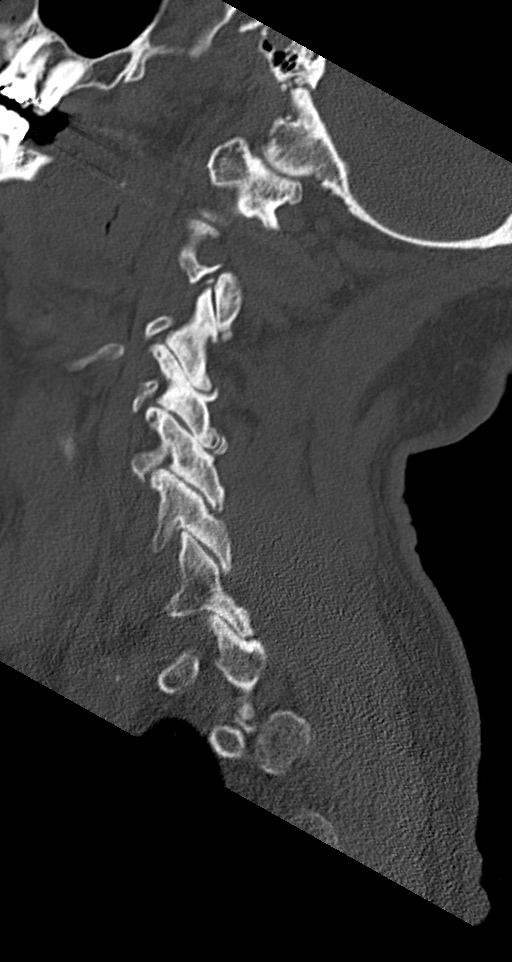
[im 41/61  bone]
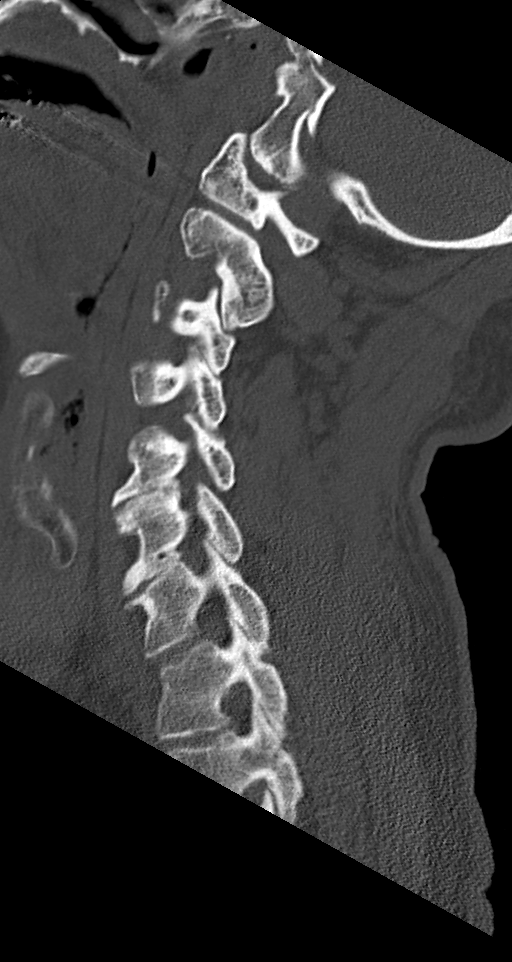

[Series 14: max soft · axial · 0.41mm/px · z∈[-90,-20]mm · 2 of 105 slices shown]
[im 35/105  soft-tissue]
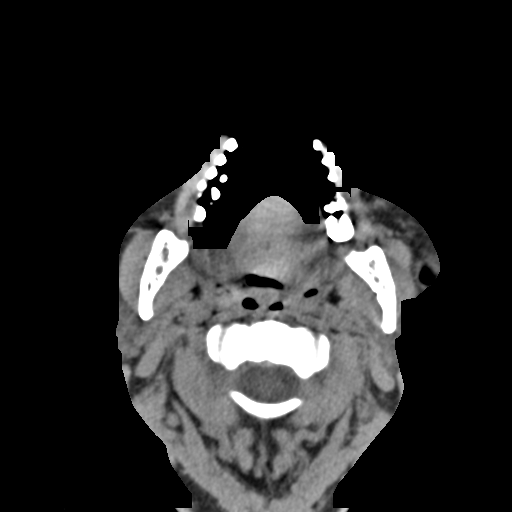
[im 70/105  soft-tissue]
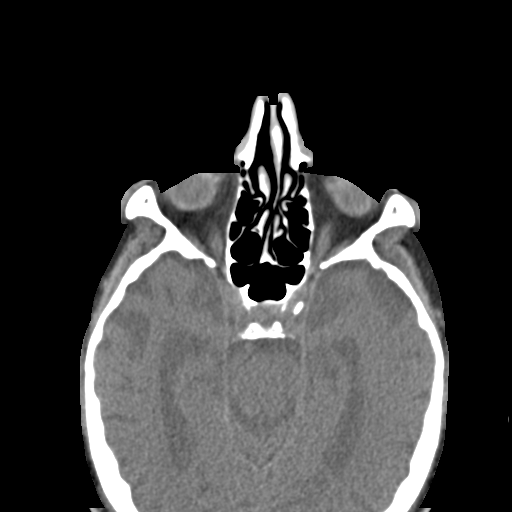

[9 of 33 positions shown; findings below may reference images not displayed]

FINDINGS: CT HEAD FINDINGS

Brain: Mild age-related cerebral volume loss with chronic small
vessel ischemic disease. No acute intracranial hemorrhage. No acute
large vessel territory infarct. No mass lesion, midline shift or
mass effect. No hydrocephalus. No extra-axial fluid collection.

Vascular: No hyperdense vessel. Scattered vascular calcifications
noted within the carotid siphons.

Skull: Small right periorbital scalp contusion.  Calvarium intact.

Other: Mastoid air cells are clear.

CT MAXILLOFACIAL FINDINGS

Osseous: Zygomatic arches intact. No acute maxillary fracture.
Pterygoid plates intact. Nasal bones intact. Mild right-to-left
nasal septal deviation without fracture. Mandible intact. Mandibular
condyles normally situated. No acute abnormality about the
dentition.

Orbits: Globes and orbital soft tissues within normal limits. Bony
orbits intact.

Sinuses: Paranasal sinuses are clear.  No hemosinus.

Soft tissues: Small right supraorbital/periorbital soft tissue
contusion. No other appreciable soft tissue injury about the face.

CT CERVICAL SPINE FINDINGS

Alignment: Vertebral bodies normally aligned with preservation of
the normal cervical lordosis. No listhesis or subluxation.

Skull base and vertebrae: Normal C1-2 articulations are preserved in
the dens is intact. Vertebral body heights maintained. No acute
fracture.

Soft tissues and spinal canal: Soft tissues of the neck demonstrate
no acute finding. No prevertebral edema. Spinal canal within normal
limits. Vascular calcifications about the carotid bifurcations.

Disc levels: Mild cervical spondylolysis at C5-6 and C6-7. Mild
multilevel facet arthrosis, most notable at C4-5 on the left.

Upper chest: Visualized upper chest demonstrates no acute finding.
Irregular pleuroparenchymal thickening noted at the right lung apex.

Other: None.
IMPRESSION: CT HEAD:

1. No acute intracranial abnormality.
2. Generalized age-related cerebral atrophy with mild chronic small
vessel ischemic disease.

CT MAXILLOFACIAL:

1. Small right supraorbital/periorbital soft tissue contusion.
2. No other acute maxillofacial injury. No fracture. Intact globes
with no retro-orbital pathology.

CT CERVICAL SPINE:

1. No acute traumatic injury within the cervical spine.
2. Mild degenerative spondylolysis at C5-6 and C6-7.

## 2020-10-09 IMAGING — CR DG PELVIS 1-2V
1 series · 1 of 1 positions shown · non-contrast
Comparison: None.

CLINICAL DATA: Initial evaluation for acute trauma, fall. Evaluate
for fracture.

EXAM:
PELVIS - 1-2 VIEW

[x pelvis]
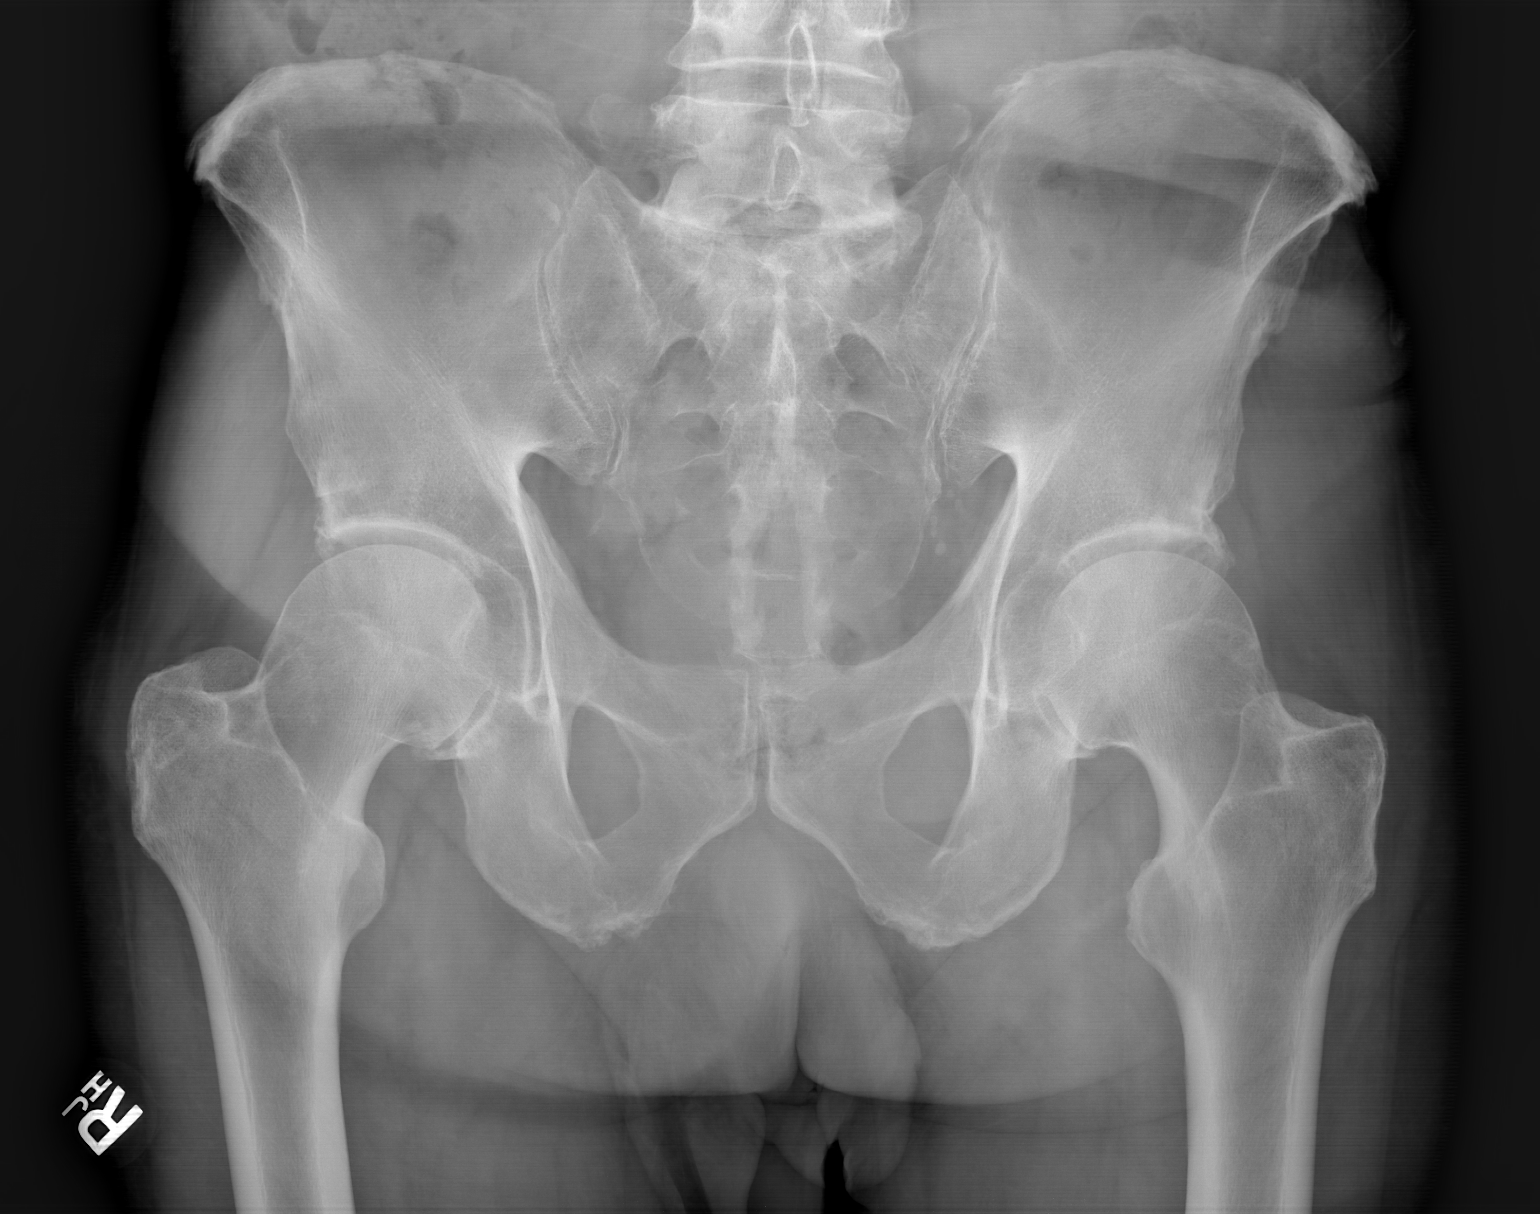

[1 of 1 positions shown; findings below may reference images not displayed]

FINDINGS: A subtle linear lucency extends through the intertrochanteric region
of the right femur. While this may reflect summation of shadows due
to overlying skin fold, a subtle acute nondisplaced fracture is
difficult to exclude. No other acute fracture dislocation. SI joints
approximated symmetric. No pubic diastasis. Bony pelvis intact. No
abnormality about the left hip.

Mild to moderate degenerative osteoarthrosis about the hips
bilaterally.

No appreciable soft tissue injury.
IMPRESSION: 1. Subtle linear lucency extending through the intertrochanteric
fracture of the right femur. While this could reflect summation of
shadows due to overlying skin fold, a possible acute nondisplaced
fracture is difficult to exclude. Correlation with physical exam
recommended. Additionally, further assessment with dedicated
radiograph of the right hip suggested for further evaluation.
